# Patient Record
Sex: Female | Born: 1990 | Race: White | Hispanic: No | Marital: Married | Smoking: Never smoker
Health system: Southern US, Community
[De-identification: ages and names within clinical notes are randomized; demographics above are authoritative.]

## PROBLEM LIST (undated history)

## (undated) DIAGNOSIS — Z3184 Encounter for fertility preservation procedure: Secondary | ICD-10-CM

## (undated) DIAGNOSIS — C8198 Hodgkin lymphoma, unspecified, lymph nodes of multiple sites: Secondary | ICD-10-CM

## (undated) HISTORY — DX: Hodgkin lymphoma, unspecified, lymph nodes of multiple sites: C81.98

## (undated) HISTORY — DX: Encounter for fertility preservation procedure: Z31.84

## (undated) HISTORY — PX: OTHER SURGICAL HISTORY: SHX169

---
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
## 2009-05-22 ENCOUNTER — Ambulatory Visit: Payer: Self-pay | Admitting: Pediatrics

## 2010-07-31 HISTORY — PX: LYMPH NODE BIOPSY: SHX201

## 2011-02-27 ENCOUNTER — Ambulatory Visit: Payer: Self-pay | Admitting: Internal Medicine

## 2011-03-03 ENCOUNTER — Ambulatory Visit: Payer: Self-pay | Admitting: Internal Medicine

## 2011-03-03 ENCOUNTER — Other Ambulatory Visit: Payer: Self-pay | Admitting: Internal Medicine

## 2011-05-01 ENCOUNTER — Ambulatory Visit: Payer: Self-pay | Admitting: Internal Medicine

## 2011-05-01 HISTORY — PX: PORTACATH PLACEMENT: SHX2246

## 2011-05-15 ENCOUNTER — Ambulatory Visit: Payer: Self-pay | Admitting: Internal Medicine

## 2011-05-15 ENCOUNTER — Other Ambulatory Visit: Payer: Self-pay | Admitting: Internal Medicine

## 2011-05-16 ENCOUNTER — Ambulatory Visit: Payer: Self-pay | Admitting: Internal Medicine

## 2011-05-17 ENCOUNTER — Ambulatory Visit: Payer: Self-pay | Admitting: Internal Medicine

## 2011-05-19 ENCOUNTER — Ambulatory Visit: Payer: Self-pay | Admitting: Internal Medicine

## 2011-05-24 ENCOUNTER — Ambulatory Visit: Payer: Self-pay | Admitting: General Surgery

## 2011-06-01 ENCOUNTER — Ambulatory Visit: Payer: Self-pay | Admitting: Internal Medicine

## 2011-06-01 DIAGNOSIS — Z3184 Encounter for fertility preservation procedure: Secondary | ICD-10-CM

## 2011-06-01 HISTORY — DX: Encounter for fertility preservation procedure: Z31.84

## 2011-06-01 LAB — PATHOLOGY REPORT

## 2011-06-06 ENCOUNTER — Ambulatory Visit: Payer: Self-pay | Admitting: General Surgery

## 2011-07-01 ENCOUNTER — Ambulatory Visit: Payer: Self-pay | Admitting: Internal Medicine

## 2011-08-01 ENCOUNTER — Ambulatory Visit: Payer: Self-pay | Admitting: Internal Medicine

## 2011-08-04 LAB — CBC CANCER CENTER
Basophil %: 0.5 %
Eosinophil #: 0.2 x10 3/mm (ref 0.0–0.7)
HCT: 35.4 % (ref 35.0–47.0)
Lymphocyte #: 2.9 x10 3/mm (ref 1.0–3.6)
MCV: 87 fL (ref 80–100)
Monocyte #: 0.3 x10 3/mm (ref 0.0–0.7)
Monocyte %: 5.9 %
Neutrophil #: 2.4 x10 3/mm (ref 1.4–6.5)
RBC: 4.09 10*6/uL (ref 3.80–5.20)
WBC: 5.9 x10 3/mm (ref 3.6–11.0)

## 2011-08-11 LAB — COMPREHENSIVE METABOLIC PANEL
Albumin: 3.5 g/dL (ref 3.4–5.0)
Alkaline Phosphatase: 106 U/L (ref 50–136)
Bilirubin,Total: 0.3 mg/dL (ref 0.2–1.0)
Creatinine: 0.89 mg/dL (ref 0.60–1.30)
Glucose: 88 mg/dL (ref 65–99)
Osmolality: 280 (ref 275–301)
Potassium: 4.4 mmol/L (ref 3.5–5.1)
SGOT(AST): 23 U/L (ref 15–37)
Sodium: 141 mmol/L (ref 136–145)
Total Protein: 6.9 g/dL (ref 6.4–8.2)

## 2011-08-11 LAB — CBC CANCER CENTER
Basophil #: 0 x10 3/mm (ref 0.0–0.1)
Basophil %: 0.5 %
Eosinophil #: 0.1 x10 3/mm (ref 0.0–0.7)
Lymphocyte #: 2.8 x10 3/mm (ref 1.0–3.6)
Lymphocyte %: 39.4 %
MCHC: 33.9 g/dL (ref 32.0–36.0)
MCV: 87 fL (ref 80–100)
Monocyte #: 0.5 x10 3/mm (ref 0.0–0.7)
Monocyte %: 6.6 %
Neutrophil #: 3.7 x10 3/mm (ref 1.4–6.5)
Platelet: 289 x10 3/mm (ref 150–440)
RDW: 16.5 % — ABNORMAL HIGH (ref 11.5–14.5)
WBC: 7.2 x10 3/mm (ref 3.6–11.0)

## 2011-08-18 LAB — CBC CANCER CENTER
Basophil #: 0.1 x10 3/mm (ref 0.0–0.1)
Basophil %: 0.9 %
Eosinophil #: 0 x10 3/mm (ref 0.0–0.7)
HCT: 37.8 % (ref 35.0–47.0)
HGB: 12.8 g/dL (ref 12.0–16.0)
Lymphocyte %: 29.8 %
MCHC: 33.7 g/dL (ref 32.0–36.0)
Monocyte #: 0.5 x10 3/mm (ref 0.0–0.7)
Monocyte %: 6 %
Neutrophil #: 4.8 x10 3/mm (ref 1.4–6.5)
Neutrophil %: 62.8 %
Platelet: 397 x10 3/mm (ref 150–440)
RBC: 4.34 10*6/uL (ref 3.80–5.20)
WBC: 7.6 x10 3/mm (ref 3.6–11.0)

## 2011-08-21 LAB — URINALYSIS, COMPLETE
Bilirubin,UR: NEGATIVE
Blood: NEGATIVE
Glucose,UR: NEGATIVE mg/dL (ref 0–75)
Hyaline Cast: 8
Ketone: NEGATIVE
Leukocyte Esterase: NEGATIVE
Nitrite: NEGATIVE
Ph: 6 (ref 4.5–8.0)
Protein: NEGATIVE
RBC,UR: 2 /HPF (ref 0–5)
Specific Gravity: 1.013 (ref 1.003–1.030)
Squamous Epithelial: 5
WBC UR: 7 /HPF (ref 0–5)

## 2011-08-21 LAB — COMPREHENSIVE METABOLIC PANEL
Alkaline Phosphatase: 119 U/L (ref 50–136)
Anion Gap: 8 (ref 7–16)
Bilirubin,Total: 0.3 mg/dL (ref 0.2–1.0)
Calcium, Total: 9.3 mg/dL (ref 8.5–10.1)
Chloride: 103 mmol/L (ref 98–107)
Co2: 29 mmol/L (ref 21–32)
Creatinine: 0.91 mg/dL (ref 0.60–1.30)
EGFR (African American): >60
Glucose: 91 mg/dL (ref 65–99)
Osmolality: 276 (ref 275–301)
SGPT (ALT): 46 U/L
Sodium: 140 mmol/L (ref 136–145)
Total Protein: 7.2 g/dL (ref 6.4–8.2)

## 2011-08-25 LAB — CBC CANCER CENTER
Basophil #: 0 x10 3/mm (ref 0.0–0.1)
Basophil %: 0.4 %
Eosinophil #: 0.1 x10 3/mm (ref 0.0–0.7)
Eosinophil %: 0.5 %
HCT: 33.8 % — ABNORMAL LOW (ref 35.0–47.0)
HGB: 11.5 g/dL — ABNORMAL LOW (ref 12.0–16.0)
Lymphocyte #: 3.1 x10 3/mm (ref 1.0–3.6)
Lymphocyte %: 28.7 %
MCHC: 34 g/dL (ref 32.0–36.0)
MCV: 87 fL (ref 80–100)
Monocyte %: 6.5 %
Neutrophil #: 6.9 x10 3/mm — ABNORMAL HIGH (ref 1.4–6.5)
Neutrophil %: 63.9 %
Platelet: 295 x10 3/mm (ref 150–440)
RDW: 17.3 % — ABNORMAL HIGH (ref 11.5–14.5)

## 2011-08-25 LAB — COMPREHENSIVE METABOLIC PANEL
Albumin: 3.6 g/dL (ref 3.4–5.0)
Alkaline Phosphatase: 93 U/L (ref 50–136)
BUN: 10 mg/dL (ref 7–18)
Bilirubin,Total: 0.3 mg/dL (ref 0.2–1.0)
Chloride: 106 mmol/L (ref 98–107)
Co2: 27 mmol/L (ref 21–32)
Creatinine: 0.73 mg/dL (ref 0.60–1.30)
EGFR (African American): >60
EGFR (Non-African Amer.): >60
Glucose: 95 mg/dL (ref 65–99)
SGOT(AST): 23 U/L (ref 15–37)
SGPT (ALT): 35 U/L
Total Protein: 6.5 g/dL (ref 6.4–8.2)

## 2011-09-01 ENCOUNTER — Ambulatory Visit: Payer: Self-pay | Admitting: Internal Medicine

## 2011-09-01 LAB — CBC CANCER CENTER
Basophil #: 0 x10 3/mm (ref 0.0–0.1)
Basophil %: 0.3 %
Eosinophil #: 0.1 x10 3/mm (ref 0.0–0.7)
HCT: 35.1 % (ref 35.0–47.0)
Lymphocyte %: 22.5 %
MCH: 29.3 pg (ref 26.0–34.0)
MCHC: 33.5 g/dL (ref 32.0–36.0)
Monocyte #: 0.6 x10 3/mm (ref 0.0–0.7)
Monocyte %: 5.6 %
Neutrophil %: 70.5 %
Platelet: 373 x10 3/mm (ref 150–440)
RDW: 17.5 % — ABNORMAL HIGH (ref 11.5–14.5)

## 2011-09-08 LAB — CBC CANCER CENTER
Basophil #: 0 x10 3/mm (ref 0.0–0.1)
Basophil %: 0.2 %
Eosinophil %: 1.4 %
HCT: 32.4 % — ABNORMAL LOW (ref 35.0–47.0)
HGB: 10.8 g/dL — ABNORMAL LOW (ref 12.0–16.0)
MCH: 28.9 pg (ref 26.0–34.0)
MCHC: 33.2 g/dL (ref 32.0–36.0)
MCV: 87 fL (ref 80–100)
Monocyte #: 0.6 x10 3/mm (ref 0.0–0.7)
Monocyte %: 6.5 %
Neutrophil #: 5.2 x10 3/mm (ref 1.4–6.5)
Neutrophil %: 61.5 %
RBC: 3.73 10*6/uL — ABNORMAL LOW (ref 3.80–5.20)
WBC: 8.5 x10 3/mm (ref 3.6–11.0)

## 2011-09-08 LAB — COMPREHENSIVE METABOLIC PANEL
Albumin: 3.6 g/dL (ref 3.4–5.0)
Anion Gap: 10 (ref 7–16)
BUN: 7 mg/dL (ref 7–18)
Bilirubin,Total: 0.3 mg/dL (ref 0.2–1.0)
Creatinine: 0.75 mg/dL (ref 0.60–1.30)
EGFR (African American): >60
EGFR (Non-African Amer.): >60
Glucose: 124 mg/dL — ABNORMAL HIGH (ref 65–99)
Osmolality: 281 (ref 275–301)
Potassium: 3.5 mmol/L (ref 3.5–5.1)
Sodium: 141 mmol/L (ref 136–145)
Total Protein: 6.4 g/dL (ref 6.4–8.2)

## 2011-09-18 LAB — CBC CANCER CENTER
Basophil #: 0 x10 3/mm (ref 0.0–0.1)
Basophil %: 0.2 %
Eosinophil #: 0.1 x10 3/mm (ref 0.0–0.7)
HCT: 34.6 % — ABNORMAL LOW (ref 35.0–47.0)
HGB: 11.5 g/dL — ABNORMAL LOW (ref 12.0–16.0)
Lymphocyte #: 3.2 x10 3/mm (ref 1.0–3.6)
MCH: 29.3 pg (ref 26.0–34.0)
MCHC: 33.2 g/dL (ref 32.0–36.0)
MCV: 88 fL (ref 80–100)
Monocyte #: 1 x10 3/mm — ABNORMAL HIGH (ref 0.0–0.7)
Neutrophil #: 14.2 x10 3/mm — ABNORMAL HIGH (ref 1.4–6.5)
RBC: 3.93 10*6/uL (ref 3.80–5.20)
RDW: 19.3 % — ABNORMAL HIGH (ref 11.5–14.5)
WBC: 18.6 x10 3/mm — ABNORMAL HIGH (ref 3.6–11.0)

## 2011-09-29 ENCOUNTER — Ambulatory Visit: Payer: Self-pay | Admitting: Internal Medicine

## 2011-09-29 LAB — COMPREHENSIVE METABOLIC PANEL
Albumin: 3.4 g/dL (ref 3.4–5.0)
Alkaline Phosphatase: 75 U/L (ref 50–136)
BUN: 10 mg/dL (ref 7–18)
Calcium, Total: 8.5 mg/dL (ref 8.5–10.1)
Co2: 25 mmol/L (ref 21–32)
EGFR (Non-African Amer.): >60
Glucose: 112 mg/dL — ABNORMAL HIGH (ref 65–99)
Potassium: 3.8 mmol/L (ref 3.5–5.1)
SGPT (ALT): 31 U/L
Sodium: 141 mmol/L (ref 136–145)

## 2011-09-29 LAB — CBC CANCER CENTER
Basophil #: 0 x10 3/mm (ref 0.0–0.1)
Basophil %: 0.5 %
Eosinophil %: 1.3 %
HCT: 31.1 % — ABNORMAL LOW (ref 35.0–47.0)
HGB: 10.4 g/dL — ABNORMAL LOW (ref 12.0–16.0)
Lymphocyte #: 1.9 x10 3/mm (ref 1.0–3.6)
Lymphocyte %: 32 %
MCH: 29.8 pg (ref 26.0–34.0)
MCV: 89 fL (ref 80–100)
Monocyte #: 0.6 x10 3/mm (ref 0.0–0.7)
Monocyte %: 9.7 %
Neutrophil #: 3.3 x10 3/mm (ref 1.4–6.5)
Platelet: 366 x10 3/mm (ref 150–440)
RBC: 3.48 10*6/uL — ABNORMAL LOW (ref 3.80–5.20)
WBC: 5.8 x10 3/mm (ref 3.6–11.0)

## 2011-10-04 LAB — CBC CANCER CENTER
Basophil #: 0 x10 3/mm (ref 0.0–0.1)
Basophil %: 0 %
Eosinophil %: 0.3 %
HCT: 30.9 % — ABNORMAL LOW (ref 35.0–47.0)
HGB: 10.5 g/dL — ABNORMAL LOW (ref 12.0–16.0)
Lymphocyte #: 1.3 x10 3/mm (ref 1.0–3.6)
Lymphocyte %: 14.2 %
MCH: 30.4 pg (ref 26.0–34.0)
MCHC: 34.1 g/dL (ref 32.0–36.0)
MCV: 89 fL (ref 80–100)
Monocyte %: 1.7 %
Neutrophil #: 7.7 x10 3/mm — ABNORMAL HIGH (ref 1.4–6.5)
Platelet: 206 x10 3/mm (ref 150–440)
WBC: 9.2 x10 3/mm (ref 3.6–11.0)

## 2011-10-04 LAB — BASIC METABOLIC PANEL
Anion Gap: 9 (ref 7–16)
BUN: 12 mg/dL (ref 7–18)
Creatinine: 0.73 mg/dL (ref 0.60–1.30)
EGFR (African American): >60
EGFR (Non-African Amer.): >60
Glucose: 87 mg/dL (ref 65–99)
Osmolality: 279 (ref 275–301)
Potassium: 4.1 mmol/L (ref 3.5–5.1)
Sodium: 140 mmol/L (ref 136–145)

## 2011-10-13 LAB — COMPREHENSIVE METABOLIC PANEL
Anion Gap: 10 (ref 7–16)
BUN: 11 mg/dL (ref 7–18)
Bilirubin,Total: 0.3 mg/dL (ref 0.2–1.0)
Calcium, Total: 8.8 mg/dL (ref 8.5–10.1)
Chloride: 106 mmol/L (ref 98–107)
Co2: 24 mmol/L (ref 21–32)
Creatinine: 0.79 mg/dL (ref 0.60–1.30)
EGFR (African American): >60
EGFR (Non-African Amer.): >60
Osmolality: 279 (ref 275–301)
Potassium: 4 mmol/L (ref 3.5–5.1)
Sodium: 140 mmol/L (ref 136–145)
Total Protein: 6.9 g/dL (ref 6.4–8.2)

## 2011-10-13 LAB — CBC CANCER CENTER
Basophil #: 0 x10 3/mm (ref 0.0–0.1)
Basophil %: 0.6 %
Eosinophil #: 0.1 x10 3/mm (ref 0.0–0.7)
HCT: 31.1 % — ABNORMAL LOW (ref 35.0–47.0)
HGB: 10.4 g/dL — ABNORMAL LOW (ref 12.0–16.0)
Lymphocyte #: 2.6 x10 3/mm (ref 1.0–3.6)
MCH: 30.1 pg (ref 26.0–34.0)
MCHC: 33.3 g/dL (ref 32.0–36.0)
MCV: 90 fL (ref 80–100)
Monocyte #: 0.6 x10 3/mm (ref 0.0–0.7)
Neutrophil #: 4.1 x10 3/mm (ref 1.4–6.5)
RDW: 18.7 % — ABNORMAL HIGH (ref 11.5–14.5)

## 2011-10-20 LAB — CBC CANCER CENTER
Basophil #: 0 x10 3/mm (ref 0.0–0.1)
Eosinophil #: 0.1 x10 3/mm (ref 0.0–0.7)
Eosinophil %: 1.6 %
Lymphocyte #: 2 x10 3/mm (ref 1.0–3.6)
Lymphocyte %: 24.1 %
MCH: 30 pg (ref 26.0–34.0)
MCHC: 33.4 g/dL (ref 32.0–36.0)
Monocyte #: 0.5 x10 3/mm (ref 0.0–0.7)
Monocyte %: 6.5 %
Neutrophil %: 67.2 %
Platelet: 351 x10 3/mm (ref 150–440)
RDW: 19.2 % — ABNORMAL HIGH (ref 11.5–14.5)
WBC: 8.3 x10 3/mm (ref 3.6–11.0)

## 2011-10-26 LAB — COMPREHENSIVE METABOLIC PANEL
Alkaline Phosphatase: 107 U/L (ref 50–136)
Anion Gap: 9 (ref 7–16)
Bilirubin,Total: 0.4 mg/dL (ref 0.2–1.0)
Calcium, Total: 8.5 mg/dL (ref 8.5–10.1)
Chloride: 105 mmol/L (ref 98–107)
Co2: 25 mmol/L (ref 21–32)
Creatinine: 0.9 mg/dL (ref 0.60–1.30)
EGFR (Non-African Amer.): >60
Glucose: 87 mg/dL (ref 65–99)
Osmolality: 277 (ref 275–301)
SGOT(AST): 18 U/L (ref 15–37)
SGPT (ALT): 23 U/L
Sodium: 139 mmol/L (ref 136–145)
Total Protein: 6.7 g/dL (ref 6.4–8.2)

## 2011-10-26 LAB — CBC CANCER CENTER
Basophil #: 0 x10 3/mm (ref 0.0–0.1)
Eosinophil #: 0.1 x10 3/mm (ref 0.0–0.7)
Eosinophil %: 1.3 %
Lymphocyte #: 2.5 x10 3/mm (ref 1.0–3.6)
Lymphocyte %: 24.9 %
MCH: 30.5 pg (ref 26.0–34.0)
MCHC: 33.5 g/dL (ref 32.0–36.0)
MCV: 91 fL (ref 80–100)
Monocyte #: 0.8 x10 3/mm — ABNORMAL HIGH (ref 0.0–0.7)
Neutrophil %: 65.4 %
RBC: 3.5 10*6/uL — ABNORMAL LOW (ref 3.80–5.20)
WBC: 10 x10 3/mm (ref 3.6–11.0)

## 2011-10-30 ENCOUNTER — Ambulatory Visit: Payer: Self-pay | Admitting: Internal Medicine

## 2011-11-03 ENCOUNTER — Ambulatory Visit: Payer: Self-pay | Admitting: Oncology

## 2011-11-03 LAB — CBC CANCER CENTER
Basophil #: 0 x10 3/mm (ref 0.0–0.1)
Basophil %: 0.3 %
Eosinophil #: 0.2 x10 3/mm (ref 0.0–0.7)
Eosinophil %: 1.1 %
HCT: 31 % — ABNORMAL LOW (ref 35.0–47.0)
HGB: 10.5 g/dL — ABNORMAL LOW (ref 12.0–16.0)
Lymphocyte #: 2.6 x10 3/mm (ref 1.0–3.6)
Lymphocyte %: 18.5 %
MCH: 30.5 pg (ref 26.0–34.0)
MCHC: 33.8 g/dL (ref 32.0–36.0)
MCV: 90 fL (ref 80–100)
Monocyte #: 0.8 x10 3/mm — ABNORMAL HIGH (ref 0.0–0.7)
Monocyte %: 5.9 %
Neutrophil #: 10.6 x10 3/mm — ABNORMAL HIGH (ref 1.4–6.5)
Neutrophil %: 74.2 %
Platelet: 389 x10 3/mm (ref 150–440)
RBC: 3.44 10*6/uL — ABNORMAL LOW (ref 3.80–5.20)
RDW: 19.2 % — ABNORMAL HIGH (ref 11.5–14.5)
WBC: 14.3 x10 3/mm — ABNORMAL HIGH (ref 3.6–11.0)

## 2011-11-10 LAB — BASIC METABOLIC PANEL
Anion Gap: 8 (ref 7–16)
Chloride: 106 mmol/L (ref 98–107)
Co2: 25 mmol/L (ref 21–32)
Glucose: 89 mg/dL (ref 65–99)
Osmolality: 277 (ref 275–301)
Sodium: 139 mmol/L (ref 136–145)

## 2011-11-10 LAB — CBC CANCER CENTER
Basophil #: 0.1 x10 3/mm (ref 0.0–0.1)
Basophil %: 0.6 %
Eosinophil #: 0.1 x10 3/mm (ref 0.0–0.7)
Eosinophil %: 1.4 %
HGB: 10.3 g/dL — ABNORMAL LOW (ref 12.0–16.0)
Lymphocyte %: 26.3 %
MCH: 29.8 pg (ref 26.0–34.0)
MCV: 93 fL (ref 80–100)
Neutrophil #: 5 x10 3/mm (ref 1.4–6.5)
Neutrophil %: 63.4 %
RDW: 19.1 % — ABNORMAL HIGH (ref 11.5–14.5)

## 2011-11-24 LAB — COMPREHENSIVE METABOLIC PANEL
Alkaline Phosphatase: 88 U/L (ref 50–136)
Anion Gap: 10 (ref 7–16)
Bilirubin,Total: 0.4 mg/dL (ref 0.2–1.0)
Creatinine: 0.58 mg/dL — ABNORMAL LOW (ref 0.60–1.30)
Potassium: 4 mmol/L (ref 3.5–5.1)
SGOT(AST): 11 U/L — ABNORMAL LOW (ref 15–37)
SGPT (ALT): 21 U/L
Sodium: 142 mmol/L (ref 136–145)

## 2011-11-24 LAB — CBC CANCER CENTER
Basophil #: 0 x10 3/mm (ref 0.0–0.1)
Basophil %: 0.9 %
Eosinophil %: 2.1 %
HGB: 9.8 g/dL — ABNORMAL LOW (ref 12.0–16.0)
Lymphocyte %: 33.7 %
MCH: 29.5 pg (ref 26.0–34.0)
MCHC: 31.6 g/dL — ABNORMAL LOW (ref 32.0–36.0)
Monocyte #: 0.6 x10 3/mm (ref 0.2–0.9)
Neutrophil %: 53.5 %
Platelet: 327 x10 3/mm (ref 150–440)
RBC: 3.34 10*6/uL — ABNORMAL LOW (ref 3.80–5.20)
RDW: 19 % — ABNORMAL HIGH (ref 11.5–14.5)
WBC: 5.6 x10 3/mm (ref 3.6–11.0)

## 2011-11-29 ENCOUNTER — Ambulatory Visit: Payer: Self-pay | Admitting: Internal Medicine

## 2011-12-11 LAB — COMPREHENSIVE METABOLIC PANEL
Alkaline Phosphatase: 92 U/L (ref 50–136)
BUN: 10 mg/dL (ref 7–18)
Bilirubin,Total: 0.5 mg/dL (ref 0.2–1.0)
Chloride: 105 mmol/L (ref 98–107)
Co2: 24 mmol/L (ref 21–32)
Creatinine: 0.57 mg/dL — ABNORMAL LOW (ref 0.60–1.30)
EGFR (African American): >60
Potassium: 3.8 mmol/L (ref 3.5–5.1)
SGOT(AST): 23 U/L (ref 15–37)
SGPT (ALT): 26 U/L
Total Protein: 6.2 g/dL — ABNORMAL LOW (ref 6.4–8.2)

## 2011-12-11 LAB — CBC CANCER CENTER
Basophil %: 0.4 %
Eosinophil #: 0.1 x10 3/mm (ref 0.0–0.7)
HGB: 10.3 g/dL — ABNORMAL LOW (ref 12.0–16.0)
Lymphocyte %: 26.8 %
MCH: 30.1 pg (ref 26.0–34.0)
MCV: 93 fL (ref 80–100)
Monocyte #: 0.6 x10 3/mm (ref 0.2–0.9)
Monocyte %: 9 %
RBC: 3.43 10*6/uL — ABNORMAL LOW (ref 3.80–5.20)

## 2011-12-26 LAB — COMPREHENSIVE METABOLIC PANEL
Alkaline Phosphatase: 99 U/L (ref 50–136)
BUN: 11 mg/dL (ref 7–18)
Bilirubin,Total: 0.4 mg/dL (ref 0.2–1.0)
Calcium, Total: 8.5 mg/dL (ref 8.5–10.1)
Chloride: 107 mmol/L (ref 98–107)
Creatinine: 0.75 mg/dL (ref 0.60–1.30)
EGFR (African American): >60
EGFR (Non-African Amer.): >60
Potassium: 4.1 mmol/L (ref 3.5–5.1)
SGOT(AST): 19 U/L (ref 15–37)
SGPT (ALT): 23 U/L
Sodium: 142 mmol/L (ref 136–145)
Total Protein: 6.5 g/dL (ref 6.4–8.2)

## 2011-12-26 LAB — CBC CANCER CENTER
Basophil #: 0 x10 3/mm (ref 0.0–0.1)
Basophil %: 0.6 %
Eosinophil %: 3.5 %
HGB: 10.6 g/dL — ABNORMAL LOW (ref 12.0–16.0)
Lymphocyte #: 2.1 x10 3/mm (ref 1.0–3.6)
MCH: 30.1 pg (ref 26.0–34.0)
MCV: 93 fL (ref 80–100)
Neutrophil #: 5.1 x10 3/mm (ref 1.4–6.5)
Neutrophil %: 63.6 %
WBC: 8 x10 3/mm (ref 3.6–11.0)

## 2011-12-30 ENCOUNTER — Ambulatory Visit: Payer: Self-pay | Admitting: Internal Medicine

## 2012-02-06 ENCOUNTER — Ambulatory Visit: Payer: Self-pay | Admitting: Oncology

## 2012-02-06 LAB — CBC CANCER CENTER
Basophil #: 0 x10 3/mm (ref 0.0–0.1)
Lymphocyte #: 2.7 x10 3/mm (ref 1.0–3.6)
MCV: 91 fL (ref 80–100)
Monocyte %: 8.1 %
Platelet: 313 x10 3/mm (ref 150–440)
RDW: 17.8 % — ABNORMAL HIGH (ref 11.5–14.5)
WBC: 5.5 x10 3/mm (ref 3.6–11.0)

## 2012-02-06 LAB — COMPREHENSIVE METABOLIC PANEL
Albumin: 3.6 g/dL (ref 3.4–5.0)
Anion Gap: 6 — ABNORMAL LOW (ref 7–16)
BUN: 14 mg/dL (ref 7–18)
Bilirubin,Total: 0.6 mg/dL (ref 0.2–1.0)
Chloride: 105 mmol/L (ref 98–107)
Co2: 28 mmol/L (ref 21–32)
Creatinine: 0.87 mg/dL (ref 0.60–1.30)
Osmolality: 277 (ref 275–301)
Potassium: 3.8 mmol/L (ref 3.5–5.1)
Sodium: 139 mmol/L (ref 136–145)
Total Protein: 6.9 g/dL (ref 6.4–8.2)

## 2012-02-06 LAB — SEDIMENTATION RATE: Erythrocyte Sed Rate: 13 mm/hr (ref 0–20)

## 2012-02-29 ENCOUNTER — Ambulatory Visit: Payer: Self-pay | Admitting: Oncology

## 2012-03-19 LAB — COMPREHENSIVE METABOLIC PANEL
Albumin: 3.6 g/dL (ref 3.4–5.0)
Anion Gap: 9 (ref 7–16)
Calcium, Total: 8.8 mg/dL (ref 8.5–10.1)
Chloride: 104 mmol/L (ref 98–107)
Co2: 25 mmol/L (ref 21–32)
EGFR (African American): >60
EGFR (Non-African Amer.): >60
Osmolality: 276 (ref 275–301)
Potassium: 3.8 mmol/L (ref 3.5–5.1)
SGOT(AST): 19 U/L (ref 15–37)
Sodium: 138 mmol/L (ref 136–145)

## 2012-03-19 LAB — CBC CANCER CENTER
Basophil #: 0 x10 3/mm (ref 0.0–0.1)
Eosinophil %: 1.5 %
HGB: 10.9 g/dL — ABNORMAL LOW (ref 12.0–16.0)
Lymphocyte %: 30.9 %
MCV: 89 fL (ref 80–100)
Monocyte %: 5.3 %
Neutrophil %: 61.8 %
Platelet: 298 x10 3/mm (ref 150–440)
RBC: 3.78 10*6/uL — ABNORMAL LOW (ref 3.80–5.20)
WBC: 6.3 x10 3/mm (ref 3.6–11.0)

## 2012-03-31 ENCOUNTER — Ambulatory Visit: Payer: Self-pay | Admitting: Oncology

## 2012-05-14 ENCOUNTER — Ambulatory Visit: Payer: Self-pay | Admitting: Oncology

## 2012-05-31 ENCOUNTER — Ambulatory Visit: Payer: Self-pay | Admitting: Oncology

## 2012-06-17 LAB — CBC CANCER CENTER
Basophil #: 0 x10 3/mm (ref 0.0–0.1)
HGB: 12.2 g/dL (ref 12.0–16.0)
Lymphocyte #: 2.1 x10 3/mm (ref 1.0–3.6)
MCH: 29.5 pg (ref 26.0–34.0)
MCV: 91 fL (ref 80–100)
Monocyte #: 0.3 x10 3/mm (ref 0.2–0.9)
Monocyte %: 6.6 %
Neutrophil #: 1.4 x10 3/mm (ref 1.4–6.5)
Neutrophil %: 35.5 %
Platelet: 278 x10 3/mm (ref 150–440)
RDW: 16 % — ABNORMAL HIGH (ref 11.5–14.5)

## 2012-06-17 LAB — COMPREHENSIVE METABOLIC PANEL
Alkaline Phosphatase: 74 U/L (ref 50–136)
BUN: 12 mg/dL (ref 7–18)
Chloride: 105 mmol/L (ref 98–107)
Co2: 24 mmol/L (ref 21–32)
EGFR (African American): >60
EGFR (Non-African Amer.): >60
SGOT(AST): 17 U/L (ref 15–37)
SGPT (ALT): 19 U/L (ref 12–78)
Total Protein: 6.9 g/dL (ref 6.4–8.2)

## 2012-06-17 LAB — LACTATE DEHYDROGENASE: LDH: 143 U/L (ref 81–246)

## 2012-06-21 ENCOUNTER — Ambulatory Visit: Payer: Self-pay | Admitting: Oncology

## 2012-06-30 ENCOUNTER — Ambulatory Visit: Payer: Self-pay | Admitting: Oncology

## 2012-06-30 ENCOUNTER — Ambulatory Visit: Payer: Self-pay | Admitting: Internal Medicine

## 2012-10-09 ENCOUNTER — Ambulatory Visit: Payer: Self-pay | Admitting: Oncology

## 2012-10-10 LAB — COMPREHENSIVE METABOLIC PANEL
Albumin: 3.7 g/dL (ref 3.4–5.0)
Alkaline Phosphatase: 68 U/L (ref 50–136)
Anion Gap: 8 (ref 7–16)
BUN: 15 mg/dL (ref 7–18)
Bilirubin,Total: 1.1 mg/dL — ABNORMAL HIGH (ref 0.2–1.0)
Calcium, Total: 8.5 mg/dL (ref 8.5–10.1)
Chloride: 104 mmol/L (ref 98–107)
Co2: 27 mmol/L (ref 21–32)
Creatinine: 1.11 mg/dL (ref 0.60–1.30)
EGFR (African American): >60
EGFR (Non-African Amer.): >60
Glucose: 96 mg/dL (ref 65–99)
Osmolality: 278 (ref 275–301)
Potassium: 4 mmol/L (ref 3.5–5.1)
SGOT(AST): 14 U/L — ABNORMAL LOW (ref 15–37)
SGPT (ALT): 17 U/L (ref 12–78)
Sodium: 139 mmol/L (ref 136–145)
Total Protein: 7 g/dL (ref 6.4–8.2)

## 2012-10-10 LAB — CBC CANCER CENTER
Basophil #: 0 x10 3/mm (ref 0.0–0.1)
Basophil %: 0.8 %
Eosinophil #: 0.1 x10 3/mm (ref 0.0–0.7)
Eosinophil %: 1.1 %
HCT: 37.1 % (ref 35.0–47.0)
HGB: 12.8 g/dL (ref 12.0–16.0)
Lymphocyte #: 2.5 x10 3/mm (ref 1.0–3.6)
Lymphocyte %: 46.1 %
MCH: 32.1 pg (ref 26.0–34.0)
MCHC: 34.6 g/dL (ref 32.0–36.0)
MCV: 93 fL (ref 80–100)
Monocyte #: 0.3 x10 3/mm (ref 0.2–0.9)
Monocyte %: 5.9 %
Neutrophil #: 2.5 x10 3/mm (ref 1.4–6.5)
Neutrophil %: 46.1 %
Platelet: 281 x10 3/mm (ref 150–440)
RBC: 4 10*6/uL (ref 3.80–5.20)
RDW: 13.4 % (ref 11.5–14.5)
WBC: 5.5 x10 3/mm (ref 3.6–11.0)

## 2012-10-10 LAB — LACTATE DEHYDROGENASE: LDH: 138 U/L (ref 81–246)

## 2012-10-29 ENCOUNTER — Ambulatory Visit: Payer: Self-pay | Admitting: Oncology

## 2012-11-27 ENCOUNTER — Encounter: Payer: Self-pay | Admitting: *Deleted

## 2012-12-11 ENCOUNTER — Telehealth: Payer: Self-pay | Admitting: *Deleted

## 2012-12-11 NOTE — Telephone Encounter (Signed)
Mom called to say they are nervous about the port a cath being removed December 30 2012.  Questions answered and procedure reviewed.  Aware she may come and sign consent prior to procedure and then can take her Xanax day of procedure per Dr Lemar Livings.

## 2012-12-16 ENCOUNTER — Ambulatory Visit: Payer: Self-pay | Admitting: General Surgery

## 2012-12-29 ENCOUNTER — Ambulatory Visit: Payer: Self-pay | Admitting: Oncology

## 2012-12-30 ENCOUNTER — Ambulatory Visit: Payer: Self-pay | Admitting: General Surgery

## 2013-01-06 ENCOUNTER — Ambulatory Visit: Payer: Self-pay | Admitting: Oncology

## 2013-01-06 LAB — CBC CANCER CENTER
Basophil #: 0.1 x10 3/mm (ref 0.0–0.1)
Basophil %: 0.9 %
Eosinophil %: 1.7 %
HCT: 38.8 % (ref 35.0–47.0)
HGB: 13.2 g/dL (ref 12.0–16.0)
Lymphocyte #: 2.7 x10 3/mm (ref 1.0–3.6)
Lymphocyte %: 46 %
MCHC: 34 g/dL (ref 32.0–36.0)
Monocyte #: 0.4 x10 3/mm (ref 0.2–0.9)
Neutrophil %: 44.6 %
Platelet: 297 x10 3/mm (ref 150–440)
RBC: 4.1 10*6/uL (ref 3.80–5.20)
WBC: 5.9 x10 3/mm (ref 3.6–11.0)

## 2013-01-06 LAB — LACTATE DEHYDROGENASE: LDH: 148 U/L (ref 81–246)

## 2013-01-06 LAB — COMPREHENSIVE METABOLIC PANEL
Alkaline Phosphatase: 78 U/L (ref 50–136)
Bilirubin,Total: 1 mg/dL (ref 0.2–1.0)
Calcium, Total: 8.9 mg/dL (ref 8.5–10.1)
Chloride: 103 mmol/L (ref 98–107)
Co2: 30 mmol/L (ref 21–32)
EGFR (African American): >60
EGFR (Non-African Amer.): >60
Potassium: 3.8 mmol/L (ref 3.5–5.1)
Sodium: 138 mmol/L (ref 136–145)

## 2013-01-23 ENCOUNTER — Ambulatory Visit: Payer: Self-pay | Admitting: General Surgery

## 2013-01-28 ENCOUNTER — Ambulatory Visit: Payer: Self-pay | Admitting: Oncology

## 2013-01-30 ENCOUNTER — Encounter: Payer: Self-pay | Admitting: *Deleted

## 2013-02-06 ENCOUNTER — Encounter: Payer: Self-pay | Admitting: General Surgery

## 2013-02-06 ENCOUNTER — Ambulatory Visit (INDEPENDENT_AMBULATORY_CARE_PROVIDER_SITE_OTHER): Payer: BC Managed Care – PPO | Admitting: General Surgery

## 2013-02-06 VITALS — BP 122/74 | HR 74 | Resp 12 | Ht 66.0 in | Wt 160.0 lb

## 2013-02-06 DIAGNOSIS — C8589 Other specified types of non-Hodgkin lymphoma, extranodal and solid organ sites: Secondary | ICD-10-CM

## 2013-02-06 DIAGNOSIS — C8590 Non-Hodgkin lymphoma, unspecified, unspecified site: Secondary | ICD-10-CM

## 2013-02-06 NOTE — Patient Instructions (Addendum)
Patient to return in one week. 

## 2013-02-06 NOTE — Progress Notes (Signed)
Patient ID: Alisha Vincent, female   DOB: 1990-11-27, 22 y.o.   MRN: [id number]  Chief Complaint  Patient presents with  . Procedure    port a cath removal    HPI Alisha Vincent is a 22 y.o. female here today for an port removal. A request for port removal has been obtained for the patient's treating oncologist, Gerarda Fraction, M.D. HPI  Past Medical History  Diagnosis Date  . Hodgkin's disease, unspecified type, of lymph nodes of multiple sites   . Encounter for fertility preservation procedure Nov 2012    Past Surgical History  Procedure Laterality Date  . Lymph node biopsy  2012  . Wisdom teeth removal   AGE 20  . Portacath placement  Oct 2012    No family history on file.  Social History History  Substance Use Topics  . Smoking status: Never Smoker   . Smokeless tobacco: Never Used  . Alcohol Use: Yes    No Known Allergies  No current outpatient prescriptions on file.   No current facility-administered medications for this visit.    Review of Systems Review of Systems  Constitutional: Negative.   Respiratory: Negative.   Cardiovascular: Negative.     Blood pressure 122/74, pulse 74, resp. rate 12, height 5\' 6"  (1.676 m), weight 160 lb (72.576 kg), last menstrual period 02/05/2013.  Physical Exam Physical Exam The power port site on the left chest wall shows no evidence of infection or induration.  Data Reviewed No data to review.  Assessment    Hodgkin's disease, no need for continued central venous access.     Plan    The procedure was reviewed with the patient and her mother, the lateral range of the procedure room during the excision. The site was prepped with ChloraPrep and 10 cc of 0.5% Xylocaine with 0.25% Marcaine with 1-200,000 units of epinephrine and was well tolerated. This was supplemented with 5 cc 1% plain Xylocaine during the procedure. ChloraPrep was again applied to the skin and the previous incision excised, as the scar had widened to  about 8 mm in width. The excised skin was discarded. The port was mobilized to the transfixion sutures removed. The catheter was removed intact without incident. The wound was closed in layers with a running 3-0 Vicryl to the adipose layer and a running 4-0 Vicryl subcutaneous to sutured to the skin. Benzoin and Steri-Strips followed by Telfa Tegaderm dressing was applied. The procedure was well tolerated. Post procedure instructions were provided. The patient will return in one week for nursing check.       Alisha Vincent 02/07/2013, 6:54 PM

## 2013-02-07 ENCOUNTER — Encounter: Payer: Self-pay | Admitting: General Surgery

## 2013-02-07 DIAGNOSIS — C8190 Hodgkin lymphoma, unspecified, unspecified site: Secondary | ICD-10-CM | POA: Insufficient documentation

## 2013-02-12 ENCOUNTER — Encounter: Payer: Self-pay | Admitting: General Surgery

## 2013-02-13 ENCOUNTER — Ambulatory Visit (INDEPENDENT_AMBULATORY_CARE_PROVIDER_SITE_OTHER): Payer: BC Managed Care – PPO | Admitting: *Deleted

## 2013-02-13 DIAGNOSIS — C8589 Other specified types of non-Hodgkin lymphoma, extranodal and solid organ sites: Secondary | ICD-10-CM

## 2013-02-13 DIAGNOSIS — C8590 Non-Hodgkin lymphoma, unspecified, unspecified site: Secondary | ICD-10-CM

## 2013-02-13 NOTE — Progress Notes (Signed)
Patient came in today for an  post port removal checked.  The wound is clean, with no signs of infection noted. Steristrip in place and aware it may come off in one week.  Minimal bruising noted.  The patient is aware that a heating pad may be used for comfort as needed. Follow up as scheduled.

## 2013-04-18 ENCOUNTER — Ambulatory Visit: Payer: Self-pay | Admitting: Oncology

## 2013-04-18 LAB — CBC CANCER CENTER
Basophil %: 0.7 %
HCT: 41.8 % (ref 35.0–47.0)
HGB: 14.2 g/dL (ref 12.0–16.0)
MCHC: 34 g/dL (ref 32.0–36.0)
MCV: 95 fL (ref 80–100)
Monocyte #: 0.4 x10 3/mm (ref 0.2–0.9)
Monocyte %: 5.1 %
Neutrophil #: 3.7 x10 3/mm (ref 1.4–6.5)
Neutrophil %: 49.7 %
Platelet: 301 x10 3/mm (ref 150–440)
RDW: 13 % (ref 11.5–14.5)

## 2013-04-18 LAB — COMPREHENSIVE METABOLIC PANEL
Albumin: 4 g/dL (ref 3.4–5.0)
Bilirubin,Total: 1.2 mg/dL — ABNORMAL HIGH (ref 0.2–1.0)
Calcium, Total: 9.5 mg/dL (ref 8.5–10.1)
Co2: 30 mmol/L (ref 21–32)
EGFR (African American): >60
Osmolality: 279 (ref 275–301)
Potassium: 3.9 mmol/L (ref 3.5–5.1)
SGOT(AST): 14 U/L — ABNORMAL LOW (ref 15–37)
SGPT (ALT): 16 U/L (ref 12–78)
Sodium: 141 mmol/L (ref 136–145)
Total Protein: 7.2 g/dL (ref 6.4–8.2)

## 2013-04-18 LAB — LACTATE DEHYDROGENASE: LDH: 126 U/L (ref 81–246)

## 2013-04-18 LAB — MAGNESIUM: Magnesium: 1.9 mg/dL

## 2013-04-30 ENCOUNTER — Ambulatory Visit: Payer: Self-pay | Admitting: Oncology

## 2013-07-16 ENCOUNTER — Ambulatory Visit: Payer: Self-pay | Admitting: Oncology

## 2013-07-16 LAB — CBC CANCER CENTER
Basophil #: 0 x10 3/mm (ref 0.0–0.1)
Eosinophil #: 0.1 x10 3/mm (ref 0.0–0.7)
HGB: 13.4 g/dL (ref 12.0–16.0)
Lymphocyte #: 2.5 x10 3/mm (ref 1.0–3.6)
Lymphocyte %: 36.4 %
MCH: 31.5 pg (ref 26.0–34.0)
MCHC: 33.3 g/dL (ref 32.0–36.0)
MCV: 95 fL (ref 80–100)
Monocyte %: 5.8 %
Neutrophil #: 3.8 x10 3/mm (ref 1.4–6.5)
Neutrophil %: 56.5 %
RBC: 4.26 10*6/uL (ref 3.80–5.20)
RDW: 12.4 % (ref 11.5–14.5)
WBC: 6.7 x10 3/mm (ref 3.6–11.0)

## 2013-07-16 LAB — COMPREHENSIVE METABOLIC PANEL
Albumin: 3.7 g/dL (ref 3.4–5.0)
Alkaline Phosphatase: 62 U/L
Anion Gap: 9 (ref 7–16)
Calcium, Total: 8.9 mg/dL (ref 8.5–10.1)
Creatinine: 0.83 mg/dL (ref 0.60–1.30)
EGFR (African American): >60
EGFR (Non-African Amer.): >60
Osmolality: 277 (ref 275–301)
Potassium: 3.7 mmol/L (ref 3.5–5.1)
SGPT (ALT): 19 U/L (ref 12–78)
Sodium: 140 mmol/L (ref 136–145)
Total Protein: 7 g/dL (ref 6.4–8.2)

## 2013-07-31 ENCOUNTER — Ambulatory Visit: Payer: Self-pay | Admitting: Oncology

## 2013-10-15 ENCOUNTER — Ambulatory Visit: Payer: Self-pay | Admitting: Internal Medicine

## 2013-10-16 ENCOUNTER — Ambulatory Visit: Payer: Self-pay | Admitting: Oncology

## 2013-10-17 LAB — CBC CANCER CENTER
Basophil #: 0.1 x10 3/mm (ref 0.0–0.1)
Basophil %: 0.5 %
EOS ABS: 0.1 x10 3/mm (ref 0.0–0.7)
Eosinophil %: 1.2 %
HCT: 38.6 % (ref 35.0–47.0)
HGB: 12.7 g/dL (ref 12.0–16.0)
Lymphocyte #: 2.7 x10 3/mm (ref 1.0–3.6)
Lymphocyte %: 25.4 %
MCH: 31.4 pg (ref 26.0–34.0)
MCHC: 33 g/dL (ref 32.0–36.0)
MCV: 95 fL (ref 80–100)
MONO ABS: 0.6 x10 3/mm (ref 0.2–0.9)
MONOS PCT: 5.4 %
NEUTROS ABS: 7 x10 3/mm — AB (ref 1.4–6.5)
Neutrophil %: 67.5 %
Platelet: 326 x10 3/mm (ref 150–440)
RBC: 4.07 10*6/uL (ref 3.80–5.20)
RDW: 12.7 % (ref 11.5–14.5)
WBC: 10.4 x10 3/mm (ref 3.6–11.0)

## 2013-10-17 LAB — COMPREHENSIVE METABOLIC PANEL
ALBUMIN: 3.4 g/dL (ref 3.4–5.0)
ALT: 11 U/L — AB (ref 12–78)
Alkaline Phosphatase: 84 U/L
Anion Gap: 9 (ref 7–16)
BILIRUBIN TOTAL: 0.5 mg/dL (ref 0.2–1.0)
BUN: 13 mg/dL (ref 7–18)
CALCIUM: 9 mg/dL (ref 8.5–10.1)
Chloride: 105 mmol/L (ref 98–107)
Co2: 28 mmol/L (ref 21–32)
Creatinine: 0.83 mg/dL (ref 0.60–1.30)
EGFR (African American): >60
Glucose: 88 mg/dL (ref 65–99)
OSMOLALITY: 283 (ref 275–301)
Potassium: 3.8 mmol/L (ref 3.5–5.1)
SGOT(AST): 12 U/L — ABNORMAL LOW (ref 15–37)
Sodium: 142 mmol/L (ref 136–145)
Total Protein: 7.1 g/dL (ref 6.4–8.2)

## 2013-10-17 LAB — LACTATE DEHYDROGENASE: LDH: 114 U/L (ref 81–246)

## 2013-10-29 ENCOUNTER — Ambulatory Visit: Payer: Self-pay | Admitting: Oncology

## 2014-01-16 ENCOUNTER — Ambulatory Visit: Payer: Self-pay | Admitting: Oncology

## 2014-01-23 ENCOUNTER — Ambulatory Visit: Payer: Self-pay | Admitting: Oncology

## 2014-01-23 LAB — CBC CANCER CENTER
Basophil #: 0 x10 3/mm (ref 0.0–0.1)
Basophil %: 0.5 %
EOS ABS: 0.1 x10 3/mm (ref 0.0–0.7)
Eosinophil %: 1 %
HCT: 37.1 % (ref 35.0–47.0)
HGB: 12.1 g/dL (ref 12.0–16.0)
Lymphocyte #: 2.7 x10 3/mm (ref 1.0–3.6)
Lymphocyte %: 34.7 %
MCH: 30 pg (ref 26.0–34.0)
MCHC: 32.8 g/dL (ref 32.0–36.0)
MCV: 92 fL (ref 80–100)
Monocyte #: 0.4 x10 3/mm (ref 0.2–0.9)
Monocyte %: 5.7 %
Neutrophil #: 4.6 x10 3/mm (ref 1.4–6.5)
Neutrophil %: 58.1 %
PLATELETS: 386 x10 3/mm (ref 150–440)
RBC: 4.05 10*6/uL (ref 3.80–5.20)
RDW: 13.5 % (ref 11.5–14.5)
WBC: 7.9 x10 3/mm (ref 3.6–11.0)

## 2014-01-23 LAB — COMPREHENSIVE METABOLIC PANEL
ALK PHOS: 91 U/L
ALT: 21 U/L (ref 12–78)
Albumin: 3.3 g/dL — ABNORMAL LOW (ref 3.4–5.0)
Anion Gap: 8 (ref 7–16)
BILIRUBIN TOTAL: 0.6 mg/dL (ref 0.2–1.0)
BUN: 7 mg/dL (ref 7–18)
Calcium, Total: 9 mg/dL (ref 8.5–10.1)
Chloride: 103 mmol/L (ref 98–107)
Co2: 29 mmol/L (ref 21–32)
Creatinine: 0.86 mg/dL (ref 0.60–1.30)
Glucose: 100 mg/dL — ABNORMAL HIGH (ref 65–99)
Osmolality: 277 (ref 275–301)
POTASSIUM: 3.6 mmol/L (ref 3.5–5.1)
SGOT(AST): 19 U/L (ref 15–37)
Sodium: 140 mmol/L (ref 136–145)
Total Protein: 7.5 g/dL (ref 6.4–8.2)

## 2014-01-23 LAB — LACTATE DEHYDROGENASE: LDH: 133 U/L (ref 81–246)

## 2014-01-28 ENCOUNTER — Ambulatory Visit: Payer: Self-pay | Admitting: Oncology

## 2014-02-23 ENCOUNTER — Ambulatory Visit: Payer: Self-pay | Admitting: Oncology

## 2014-02-28 ENCOUNTER — Ambulatory Visit: Payer: Self-pay | Admitting: Oncology

## 2014-03-02 LAB — COMPREHENSIVE METABOLIC PANEL
ALK PHOS: 100 U/L
Albumin: 3.7 g/dL (ref 3.4–5.0)
Anion Gap: 10 (ref 7–16)
BUN: 10 mg/dL (ref 7–18)
Bilirubin,Total: 0.9 mg/dL (ref 0.2–1.0)
CALCIUM: 9.6 mg/dL (ref 8.5–10.1)
CHLORIDE: 103 mmol/L (ref 98–107)
CO2: 26 mmol/L (ref 21–32)
Creatinine: 0.89 mg/dL (ref 0.60–1.30)
Glucose: 91 mg/dL (ref 65–99)
Osmolality: 276 (ref 275–301)
Potassium: 4.3 mmol/L (ref 3.5–5.1)
SGOT(AST): 12 U/L — ABNORMAL LOW (ref 15–37)
SGPT (ALT): 14 U/L
Sodium: 139 mmol/L (ref 136–145)
TOTAL PROTEIN: 8.2 g/dL (ref 6.4–8.2)

## 2014-03-02 LAB — CBC CANCER CENTER
BASOS ABS: 0 x10 3/mm (ref 0.0–0.1)
Basophil %: 0.8 %
EOS PCT: 0.6 %
Eosinophil #: 0 x10 3/mm (ref 0.0–0.7)
HCT: 39.3 % (ref 35.0–47.0)
HGB: 13.1 g/dL (ref 12.0–16.0)
LYMPHS ABS: 2.2 x10 3/mm (ref 1.0–3.6)
Lymphocyte %: 36.4 %
MCH: 30 pg (ref 26.0–34.0)
MCHC: 33.2 g/dL (ref 32.0–36.0)
MCV: 90 fL (ref 80–100)
MONO ABS: 0.3 x10 3/mm (ref 0.2–0.9)
Monocyte %: 5 %
Neutrophil #: 3.5 x10 3/mm (ref 1.4–6.5)
Neutrophil %: 57.2 %
PLATELETS: 412 x10 3/mm (ref 150–440)
RBC: 4.36 10*6/uL (ref 3.80–5.20)
RDW: 13.8 % (ref 11.5–14.5)
WBC: 6.1 x10 3/mm (ref 3.6–11.0)

## 2014-03-02 LAB — LACTATE DEHYDROGENASE: LDH: 126 U/L (ref 81–246)

## 2014-03-31 ENCOUNTER — Ambulatory Visit: Payer: Self-pay | Admitting: Oncology

## 2014-04-30 ENCOUNTER — Ambulatory Visit: Payer: Self-pay | Admitting: Oncology

## 2014-05-01 ENCOUNTER — Ambulatory Visit: Payer: Self-pay | Admitting: Unknown Physician Specialty

## 2014-05-13 ENCOUNTER — Ambulatory Visit: Payer: BC Managed Care – PPO | Admitting: General Surgery

## 2014-05-15 ENCOUNTER — Ambulatory Visit: Payer: Self-pay | Admitting: Vascular Surgery

## 2014-05-15 LAB — CBC WITH DIFFERENTIAL/PLATELET
COMMENT - H1-COM2: NORMAL
Eosinophil: 1 %
HCT: 37.7 % (ref 35.0–47.0)
HGB: 12 g/dL (ref 12.0–16.0)
LYMPHS PCT: 26 %
MCH: 29.8 pg (ref 26.0–34.0)
MCHC: 31.8 g/dL — ABNORMAL LOW (ref 32.0–36.0)
MCV: 94 fL (ref 80–100)
MONOS PCT: 2 %
Platelet: 303 10*3/uL (ref 150–440)
RBC: 4.02 10*6/uL (ref 3.80–5.20)
RDW: 14.2 % (ref 11.5–14.5)
SEGMENTED NEUTROPHILS: 71 %
WBC: 7.7 10*3/uL (ref 3.6–11.0)

## 2014-05-15 LAB — PROTIME-INR
INR: 1
PROTHROMBIN TIME: 13.3 s (ref 11.5–14.7)

## 2014-05-15 LAB — HCG, QUANTITATIVE, PREGNANCY: Beta Hcg, Quant.: <1 m[IU]/mL — ABNORMAL LOW

## 2014-05-19 LAB — COMPREHENSIVE METABOLIC PANEL
Albumin: 3.6 g/dL (ref 3.4–5.0)
Alkaline Phosphatase: 96 U/L
Anion Gap: 9 (ref 7–16)
BILIRUBIN TOTAL: 0.9 mg/dL (ref 0.2–1.0)
BUN: 10 mg/dL (ref 7–18)
Calcium, Total: 9.3 mg/dL (ref 8.5–10.1)
Chloride: 103 mmol/L (ref 98–107)
Co2: 28 mmol/L (ref 21–32)
Creatinine: 0.82 mg/dL (ref 0.60–1.30)
EGFR (African American): >60
EGFR (Non-African Amer.): >60
GLUCOSE: 101 mg/dL — AB (ref 65–99)
Osmolality: 279 (ref 275–301)
POTASSIUM: 3.7 mmol/L (ref 3.5–5.1)
SGOT(AST): 11 U/L — ABNORMAL LOW (ref 15–37)
SGPT (ALT): 17 U/L
SODIUM: 140 mmol/L (ref 136–145)
Total Protein: 7.6 g/dL (ref 6.4–8.2)

## 2014-05-19 LAB — CBC CANCER CENTER
Basophil #: 0 x10 3/mm (ref 0.0–0.1)
Basophil %: 0.5 %
Eosinophil #: 0.1 x10 3/mm (ref 0.0–0.7)
Eosinophil %: 0.9 %
HCT: 40.4 % (ref 35.0–47.0)
HGB: 13.1 g/dL (ref 12.0–16.0)
LYMPHS ABS: 2.4 x10 3/mm (ref 1.0–3.6)
Lymphocyte %: 26 %
MCH: 30.4 pg (ref 26.0–34.0)
MCHC: 32.4 g/dL (ref 32.0–36.0)
MCV: 94 fL (ref 80–100)
Monocyte #: 0.4 x10 3/mm (ref 0.2–0.9)
Monocyte %: 4.8 %
Neutrophil #: 6.2 x10 3/mm (ref 1.4–6.5)
Neutrophil %: 67.8 %
Platelet: 313 x10 3/mm (ref 150–440)
RBC: 4.32 10*6/uL (ref 3.80–5.20)
RDW: 14 % (ref 11.5–14.5)
WBC: 9.1 x10 3/mm (ref 3.6–11.0)

## 2014-05-19 LAB — LACTATE DEHYDROGENASE: LDH: 149 U/L (ref 81–246)

## 2014-05-21 LAB — URINALYSIS, COMPLETE
Bilirubin,UR: NEGATIVE
Glucose,UR: 50 mg/dL (ref 0–75)
Ketone: NEGATIVE
Leukocyte Esterase: NEGATIVE
Nitrite: NEGATIVE
Ph: 6 (ref 4.5–8.0)
Protein: 100
RBC,UR: 1154 /HPF (ref 0–5)
Specific Gravity: 1.016 (ref 1.003–1.030)
Squamous Epithelial: 1
WBC UR: 36 /HPF (ref 0–5)

## 2014-05-27 LAB — COMPREHENSIVE METABOLIC PANEL
ALT: 22 U/L
AST: 17 U/L (ref 15–37)
Albumin: 3.2 g/dL — ABNORMAL LOW (ref 3.4–5.0)
Alkaline Phosphatase: 88 U/L
Anion Gap: 5 — ABNORMAL LOW (ref 7–16)
BUN: 13 mg/dL (ref 7–18)
Bilirubin,Total: 0.5 mg/dL (ref 0.2–1.0)
CO2: 29 mmol/L (ref 21–32)
Calcium, Total: 8.8 mg/dL (ref 8.5–10.1)
Chloride: 102 mmol/L (ref 98–107)
Creatinine: 0.77 mg/dL (ref 0.60–1.30)
Glucose: 87 mg/dL (ref 65–99)
OSMOLALITY: 271 (ref 275–301)
Potassium: 3.7 mmol/L (ref 3.5–5.1)
Sodium: 136 mmol/L (ref 136–145)
Total Protein: 6.7 g/dL (ref 6.4–8.2)

## 2014-05-27 LAB — CBC CANCER CENTER
Basophil #: 0 x10 3/mm (ref 0.0–0.1)
Basophil %: 1.5 %
Eosinophil #: 0 x10 3/mm (ref 0.0–0.7)
Eosinophil %: 1.2 %
HCT: 31.3 % — ABNORMAL LOW (ref 35.0–47.0)
HGB: 10.8 g/dL — ABNORMAL LOW (ref 12.0–16.0)
Lymphocyte #: 1.4 x10 3/mm (ref 1.0–3.6)
Lymphocyte %: 71.2 %
MCH: 31.7 pg (ref 26.0–34.0)
MCHC: 34.5 g/dL (ref 32.0–36.0)
MCV: 92 fL (ref 80–100)
MONO ABS: 0.2 x10 3/mm (ref 0.2–0.9)
Monocyte %: 8.5 %
NEUTROS PCT: 17.6 %
Neutrophil #: 0.3 x10 3/mm — ABNORMAL LOW (ref 1.4–6.5)
PLATELETS: 123 x10 3/mm — AB (ref 150–440)
RBC: 3.4 10*6/uL — ABNORMAL LOW (ref 3.80–5.20)
RDW: 13 % (ref 11.5–14.5)
WBC: 2 x10 3/mm — CL (ref 3.6–11.0)

## 2014-05-27 LAB — PHOSPHORUS: Phosphorus: 3.2 mg/dL (ref 2.5–4.9)

## 2014-05-27 LAB — MAGNESIUM: MAGNESIUM: 2 mg/dL

## 2014-05-31 ENCOUNTER — Ambulatory Visit: Payer: Self-pay | Admitting: Oncology

## 2014-06-01 ENCOUNTER — Encounter: Payer: Self-pay | Admitting: General Surgery

## 2014-06-02 ENCOUNTER — Encounter: Payer: Self-pay | Admitting: *Deleted

## 2014-06-09 LAB — CBC CANCER CENTER
Basophil #: 0 x10 3/mm (ref 0.0–0.1)
Basophil %: 0.6 %
Eosinophil #: 0 x10 3/mm (ref 0.0–0.7)
Eosinophil %: 0.4 %
HCT: 35.8 % (ref 35.0–47.0)
HGB: 11.6 g/dL — AB (ref 12.0–16.0)
LYMPHS ABS: 1.7 x10 3/mm (ref 1.0–3.6)
Lymphocyte %: 33 %
MCH: 30.5 pg (ref 26.0–34.0)
MCHC: 32.5 g/dL (ref 32.0–36.0)
MCV: 94 fL (ref 80–100)
MONO ABS: 0.5 x10 3/mm (ref 0.2–0.9)
MONOS PCT: 9.8 %
NEUTROS PCT: 56.2 %
Neutrophil #: 2.9 x10 3/mm (ref 1.4–6.5)
Platelet: 454 x10 3/mm — ABNORMAL HIGH (ref 150–440)
RBC: 3.81 10*6/uL (ref 3.80–5.20)
RDW: 13.6 % (ref 11.5–14.5)
WBC: 5.2 x10 3/mm (ref 3.6–11.0)

## 2014-06-09 LAB — COMPREHENSIVE METABOLIC PANEL
ALK PHOS: 76 U/L
ALT: 18 U/L
ANION GAP: 4 — AB (ref 7–16)
Albumin: 3.8 g/dL (ref 3.4–5.0)
BILIRUBIN TOTAL: 0.5 mg/dL (ref 0.2–1.0)
BUN: 9 mg/dL (ref 7–18)
CHLORIDE: 109 mmol/L — AB (ref 98–107)
CREATININE: 0.81 mg/dL (ref 0.60–1.30)
Calcium, Total: 8.9 mg/dL (ref 8.5–10.1)
Co2: 30 mmol/L (ref 21–32)
EGFR (African American): >60
EGFR (Non-African Amer.): >60
GLUCOSE: 87 mg/dL (ref 65–99)
OSMOLALITY: 283 (ref 275–301)
POTASSIUM: 4.3 mmol/L (ref 3.5–5.1)
SGOT(AST): 20 U/L (ref 15–37)
SODIUM: 143 mmol/L (ref 136–145)
Total Protein: 7.1 g/dL (ref 6.4–8.2)

## 2014-06-11 LAB — URINALYSIS, COMPLETE
Bilirubin,UR: NEGATIVE
Ketone: NEGATIVE
LEUKOCYTE ESTERASE: NEGATIVE
Nitrite: NEGATIVE
Ph: 5 (ref 4.5–8.0)
Protein: NEGATIVE
RBC,UR: 2 /HPF (ref 0–5)
Specific Gravity: 1.011 (ref 1.003–1.030)

## 2014-06-12 LAB — URINE CULTURE

## 2014-06-30 ENCOUNTER — Ambulatory Visit: Payer: Self-pay | Admitting: Oncology

## 2014-08-26 ENCOUNTER — Ambulatory Visit: Payer: Self-pay | Admitting: Oncology

## 2014-09-08 ENCOUNTER — Ambulatory Visit: Payer: Self-pay | Admitting: Oncology

## 2014-09-29 ENCOUNTER — Ambulatory Visit
Admit: 2014-09-29 | Disposition: A | Discharge status: Home / Self Care | Payer: Self-pay | Attending: Oncology | Admitting: Oncology

## 2014-10-22 LAB — CBC CANCER CENTER
BASOS ABS: 0.1 x10 3/mm (ref 0.0–0.1)
Basophil %: 1.5 %
Eosinophil #: 0.1 x10 3/mm (ref 0.0–0.7)
Eosinophil %: 1.5 %
HCT: 34.7 % — AB (ref 35.0–47.0)
HGB: 12.1 g/dL (ref 12.0–16.0)
LYMPHS PCT: 41.6 %
Lymphocyte #: 2 x10 3/mm (ref 1.0–3.6)
MCH: 34.1 pg — ABNORMAL HIGH (ref 26.0–34.0)
MCHC: 34.8 g/dL (ref 32.0–36.0)
MCV: 98 fL (ref 80–100)
MONOS PCT: 7.7 %
Monocyte #: 0.4 x10 3/mm (ref 0.2–0.9)
NEUTROS ABS: 2.3 x10 3/mm (ref 1.4–6.5)
Neutrophil %: 47.7 %
Platelet: 257 x10 3/mm (ref 150–440)
RBC: 3.54 10*6/uL — ABNORMAL LOW (ref 3.80–5.20)
RDW: 14.3 % (ref 11.5–14.5)
WBC: 4.9 x10 3/mm (ref 3.6–11.0)

## 2014-10-22 LAB — COMPREHENSIVE METABOLIC PANEL
ALT: 12 U/L — AB
AST: 20 U/L
Albumin: 4.5 g/dL
Alkaline Phosphatase: 44 U/L
Anion Gap: 8 (ref 7–16)
BILIRUBIN TOTAL: 1.6 mg/dL — AB
BUN: 10 mg/dL
CO2: 24 mmol/L
Calcium, Total: 9.2 mg/dL
Chloride: 106 mmol/L
Creatinine: 0.84 mg/dL
EGFR (African American): >60
Glucose: 125 mg/dL — ABNORMAL HIGH
Potassium: 3.5 mmol/L
Sodium: 138 mmol/L
Total Protein: 6.6 g/dL

## 2014-10-22 LAB — LACTATE DEHYDROGENASE: LDH: 152 U/L

## 2014-10-30 ENCOUNTER — Ambulatory Visit
Admit: 2014-10-30 | Disposition: A | Discharge status: Home / Self Care | Payer: Self-pay | Attending: Oncology | Admitting: Oncology

## 2014-11-03 ENCOUNTER — Ambulatory Visit
Admit: 2014-11-03 | Disposition: A | Discharge status: Home / Self Care | Payer: Self-pay | Attending: Oncology | Admitting: Oncology

## 2014-11-04 LAB — COMPREHENSIVE METABOLIC PANEL
ALK PHOS: 49 U/L
ALT: 11 U/L — AB
Albumin: 4.6 g/dL
Anion Gap: 6 — ABNORMAL LOW (ref 7–16)
BUN: 11 mg/dL
Bilirubin,Total: 1.4 mg/dL — ABNORMAL HIGH
CREATININE: 0.81 mg/dL
Calcium, Total: 9.5 mg/dL
Chloride: 105 mmol/L
Co2: 25 mmol/L
EGFR (Non-African Amer.): >60
Glucose: 96 mg/dL
POTASSIUM: 3.6 mmol/L
SGOT(AST): 17 U/L
SODIUM: 136 mmol/L
Total Protein: 7 g/dL

## 2014-11-04 LAB — CBC CANCER CENTER
Basophil #: 0 x10 3/mm (ref 0.0–0.1)
Basophil %: 0.7 %
EOS ABS: 0 x10 3/mm (ref 0.0–0.7)
Eosinophil %: 0.9 %
HCT: 37.5 % (ref 35.0–47.0)
HGB: 13.1 g/dL (ref 12.0–16.0)
LYMPHS PCT: 37.1 %
Lymphocyte #: 1.4 x10 3/mm (ref 1.0–3.6)
MCH: 34 pg (ref 26.0–34.0)
MCHC: 34.8 g/dL (ref 32.0–36.0)
MCV: 98 fL (ref 80–100)
Monocyte #: 0.3 x10 3/mm (ref 0.2–0.9)
Monocyte %: 6.7 %
NEUTROS ABS: 2.1 x10 3/mm (ref 1.4–6.5)
Neutrophil %: 54.6 %
PLATELETS: 241 x10 3/mm (ref 150–440)
RBC: 3.84 10*6/uL (ref 3.80–5.20)
RDW: 12.8 % (ref 11.5–14.5)
WBC: 3.9 x10 3/mm (ref 3.6–11.0)

## 2014-11-04 LAB — LACTATE DEHYDROGENASE: LDH: 146 U/L

## 2014-11-21 NOTE — Op Note (Signed)
PATIENT NAME:  Alisha Vincent, Alisha Vincent MR#:  [id number] DATE OF BIRTH:  Dec 17, 1990  DATE OF PROCEDURE:  05/15/2014  PREOPERATIVE DIAGNOSIS:  Hodgkin lymphoma.   POSTOPERATIVE DIAGNOSIS:  Hodgkin lymphoma.  PROCEDURES:  1.  Ultrasound guidance for vascular access, right internal jugular vein.  2.  Fluoroscopic guidance for placement of catheter.  3.  Placement of CT compatible Port-A-Cath, right internal jugular vein.   SURGEON:  Algernon Huxley, MD.  ANESTHESIA:  Local with moderate conscious sedation.   FLUOROSCOPY TIME:  Less than 1 minute.   CONTRAST:  Zero.   ESTIMATED BLOOD LOSS:  25 mL.  INDICATION FOR PROCEDURE: A 24 year old female with recurrent diagnosis of Hodgkin lymphoma.  She needs a Port-a-Cath for chemotherapy and durable venous access. Risks and benefits were discussed. Informed consent was obtained.   DESCRIPTION OF THE PROCEDURE:  The patient was brought to the vascular and interventional radiology suite. The right neck and chest were sterilely prepped and draped, and a sterile surgical field was created. Ultrasound was used to help visualize a patent right internal jugular vein. This was then accessed under direct ultrasound guidance without difficulty with a Seldinger needle and a permanent image was recorded. A J-wire was placed. After skin nick and dilatation, the peel-away sheath was then placed over the wire. I then anesthetized an area under the clavicle approximately 2 fingerbreadths. A transverse incision was created and an inferior pocket was created with electrocautery and blunt dissection. The port was then brought onto the field, placed into the pocket and secured to the chest wall with 2 Prolene sutures. The catheter was connected to the port and tunneled from the subclavicular incision to the access site. Fluoroscopic guidance was used to cut the catheter to an appropriate length. The catheter was then placed through the peel-away sheath and the peel-away sheath was  removed. The catheter tip was parked in excellent location in the initial portion of the right atrium. The pocket was then irrigated with antibiotic-impregnated saline and the wound was closed with a running 3-0 Vicryl and a 4-0 Monocryl. The access incision was closed with a single 4-0 Monocryl. The Huber needle was used to withdraw blood and flush the port with heparinized saline. Dermabond was then placed as a dressing. The patient tolerated the procedure well and was taken to the recovery room in stable condition.    ____________________________ Algernon Huxley, MD jsd:lt D: 05/16/2014 10:56:30 ET T: 05/16/2014 11:19:12 ET JOB#: [id number]  cc: Algernon Huxley, MD, <Dictator> Algernon Huxley MD ELECTRONICALLY SIGNED 05/16/2014 12:39

## 2014-12-01 ENCOUNTER — Other Ambulatory Visit: Payer: Self-pay | Admitting: Oncology

## 2014-12-01 DIAGNOSIS — C8190 Hodgkin lymphoma, unspecified, unspecified site: Secondary | ICD-10-CM

## 2014-12-24 ENCOUNTER — Telehealth: Payer: Self-pay | Admitting: *Deleted

## 2014-12-24 NOTE — Telephone Encounter (Signed)
States she was at an after work event last evening outside playing corn hole, had eaten 3 meals yesterday and felt she had drank plenty of water also and all at once she felt very hot and everything got black and she she was out for 30 seconds. This is the first time anything like this has happened before and she was told to inform md if anything different occurs. She felt dizzy and nauseated and her eyes feel "weird", she still has these symptoms this morning  I spoke with Dr Grayland Ormond who asked she come in for labs today or tomorrow and said she may have been dehydrated. She has asked to come first thing in the morning to [facility] 830 appt given for lab and block for possible IVF made

## 2014-12-25 ENCOUNTER — Inpatient Hospital Stay: Payer: BLUE CROSS/BLUE SHIELD | Attending: Oncology

## 2014-12-25 ENCOUNTER — Inpatient Hospital Stay: Payer: BLUE CROSS/BLUE SHIELD

## 2014-12-25 DIAGNOSIS — Z8571 Personal history of Hodgkin lymphoma: Secondary | ICD-10-CM | POA: Insufficient documentation

## 2014-12-25 DIAGNOSIS — Z452 Encounter for adjustment and management of vascular access device: Secondary | ICD-10-CM | POA: Insufficient documentation

## 2014-12-25 DIAGNOSIS — C8190 Hodgkin lymphoma, unspecified, unspecified site: Secondary | ICD-10-CM

## 2014-12-25 LAB — COMPREHENSIVE METABOLIC PANEL
ALT: 13 U/L — AB (ref 14–54)
ANION GAP: 9 (ref 5–15)
AST: 20 U/L (ref 15–41)
Albumin: 4 g/dL (ref 3.5–5.0)
Alkaline Phosphatase: 65 U/L (ref 38–126)
BILIRUBIN TOTAL: 1.3 mg/dL — AB (ref 0.3–1.2)
BUN: 14 mg/dL (ref 6–20)
CHLORIDE: 108 mmol/L (ref 101–111)
CO2: 23 mmol/L (ref 22–32)
Calcium: 8.9 mg/dL (ref 8.9–10.3)
Creatinine, Ser: 0.75 mg/dL (ref 0.44–1.00)
GFR calc Af Amer: >60 mL/min (ref >60)
GFR calc non Af Amer: >60 mL/min (ref >60)
GLUCOSE: 97 mg/dL (ref 65–99)
POTASSIUM: 4.1 mmol/L (ref 3.5–5.1)
SODIUM: 140 mmol/L (ref 135–145)
Total Protein: 6.6 g/dL (ref 6.5–8.1)

## 2014-12-25 LAB — CBC WITH DIFFERENTIAL/PLATELET
Basophils Absolute: 0.1 10*3/uL (ref 0–0.1)
Basophils Relative: 2 %
EOS ABS: 0.1 10*3/uL (ref 0–0.7)
Eosinophils Relative: 2 %
HCT: 37.8 % (ref 35.0–47.0)
Hemoglobin: 13.2 g/dL (ref 12.0–16.0)
LYMPHS ABS: 1.4 10*3/uL (ref 1.0–3.6)
Lymphocytes Relative: 38 %
MCH: 34.1 pg — ABNORMAL HIGH (ref 26.0–34.0)
MCHC: 34.8 g/dL (ref 32.0–36.0)
MCV: 98 fL (ref 80.0–100.0)
MONOS PCT: 8 %
Monocytes Absolute: 0.3 10*3/uL (ref 0.2–0.9)
NEUTROS PCT: 50 %
Neutro Abs: 2 10*3/uL (ref 1.4–6.5)
Platelets: 243 10*3/uL (ref 150–440)
RBC: 3.86 MIL/uL (ref 3.80–5.20)
RDW: 12.5 % (ref 11.5–14.5)
WBC: 3.8 10*3/uL (ref 3.6–11.0)

## 2014-12-25 LAB — MAGNESIUM: Magnesium: 2 mg/dL (ref 1.7–2.4)

## 2014-12-25 LAB — LACTATE DEHYDROGENASE: LDH: 125 U/L (ref 98–192)

## 2014-12-25 MED ORDER — HEPARIN SOD (PORK) LOCK FLUSH 100 UNIT/ML IV SOLN
500.0000 [IU] | Freq: Once | INTRAVENOUS | Status: AC
  Administered 2014-12-25: 500 [IU] via INTRAVENOUS

## 2014-12-25 MED ORDER — SODIUM CHLORIDE 0.9 % IJ SOLN
10.0000 mL | INTRAMUSCULAR | Status: DC | PRN
  Administered 2014-12-25: 10 mL via INTRAVENOUS
  Filled 2014-12-25: qty 10

## 2015-01-12 ENCOUNTER — Other Ambulatory Visit: Payer: BLUE CROSS/BLUE SHIELD

## 2015-01-26 ENCOUNTER — Inpatient Hospital Stay: Payer: BLUE CROSS/BLUE SHIELD | Attending: Oncology | Admitting: Oncology

## 2015-01-26 ENCOUNTER — Inpatient Hospital Stay: Payer: BLUE CROSS/BLUE SHIELD

## 2015-01-26 ENCOUNTER — Telehealth: Payer: Self-pay | Admitting: *Deleted

## 2015-01-26 VITALS — BP 114/78 | HR 84 | Temp 96.3°F | Resp 16 | Wt 144.6 lb

## 2015-01-26 DIAGNOSIS — Z9481 Bone marrow transplant status: Secondary | ICD-10-CM

## 2015-01-26 DIAGNOSIS — R509 Fever, unspecified: Secondary | ICD-10-CM | POA: Insufficient documentation

## 2015-01-26 DIAGNOSIS — Z79899 Other long term (current) drug therapy: Secondary | ICD-10-CM | POA: Diagnosis not present

## 2015-01-26 DIAGNOSIS — R0981 Nasal congestion: Secondary | ICD-10-CM | POA: Diagnosis not present

## 2015-01-26 DIAGNOSIS — C8190 Hodgkin lymphoma, unspecified, unspecified site: Secondary | ICD-10-CM

## 2015-01-26 DIAGNOSIS — Z8571 Personal history of Hodgkin lymphoma: Secondary | ICD-10-CM | POA: Diagnosis not present

## 2015-01-26 DIAGNOSIS — R531 Weakness: Secondary | ICD-10-CM | POA: Insufficient documentation

## 2015-01-26 DIAGNOSIS — R5383 Other fatigue: Secondary | ICD-10-CM

## 2015-01-26 LAB — COMPREHENSIVE METABOLIC PANEL
ALT: 14 U/L (ref 14–54)
AST: 16 U/L (ref 15–41)
Albumin: 4.4 g/dL (ref 3.5–5.0)
Alkaline Phosphatase: 73 U/L (ref 38–126)
Anion gap: 6 (ref 5–15)
BILIRUBIN TOTAL: 1.3 mg/dL — AB (ref 0.3–1.2)
BUN: 17 mg/dL (ref 6–20)
CO2: 25 mmol/L (ref 22–32)
CREATININE: 0.86 mg/dL (ref 0.44–1.00)
Calcium: 8.4 mg/dL — ABNORMAL LOW (ref 8.9–10.3)
Chloride: 105 mmol/L (ref 101–111)
GFR calc non Af Amer: >60 mL/min (ref >60)
GLUCOSE: 90 mg/dL (ref 65–99)
Potassium: 3.5 mmol/L (ref 3.5–5.1)
SODIUM: 136 mmol/L (ref 135–145)
TOTAL PROTEIN: 7.1 g/dL (ref 6.5–8.1)

## 2015-01-26 LAB — CBC WITH DIFFERENTIAL/PLATELET
BASOS ABS: 0 10*3/uL (ref 0–0.1)
Basophils Relative: 1 %
Eosinophils Absolute: 0.1 10*3/uL (ref 0–0.7)
Eosinophils Relative: 2 %
HEMATOCRIT: 41.4 % (ref 35.0–47.0)
HEMOGLOBIN: 13.9 g/dL (ref 12.0–16.0)
Lymphocytes Relative: 32 %
Lymphs Abs: 1.6 10*3/uL (ref 1.0–3.6)
MCH: 33 pg (ref 26.0–34.0)
MCHC: 33.6 g/dL (ref 32.0–36.0)
MCV: 98.3 fL (ref 80.0–100.0)
MONO ABS: 0.3 10*3/uL (ref 0.2–0.9)
Monocytes Relative: 6 %
NEUTROS ABS: 3 10*3/uL (ref 1.4–6.5)
Neutrophils Relative %: 59 %
Platelets: 270 10*3/uL (ref 150–440)
RBC: 4.21 MIL/uL (ref 3.80–5.20)
RDW: 12.4 % (ref 11.5–14.5)
WBC: 5 10*3/uL (ref 3.6–11.0)

## 2015-01-26 NOTE — Progress Notes (Signed)
Patient started having throat pain for 3 days, body aches and a headache she describes as "pounding" for 2 days.

## 2015-01-26 NOTE — Telephone Encounter (Signed)
Coming in for lab md

## 2015-01-26 NOTE — Telephone Encounter (Signed)
Called pt and got vm, asked that she call me back

## 2015-02-03 NOTE — Progress Notes (Signed)
Alisha Vincent  Telephone:(336) [phone] Fax:(336) [phone]  ID: Alisha Vincent OB: Jul 14, 1991  MR#: [id number]  NUU#:[id number]  Patient Care Team: Rusty Aus, MD as PCP - General (Unknown Physician Specialty) Robert Bellow, MD as Consulting Physician (General Surgery)  CHIEF COMPLAINT:  Chief Complaint  Patient presents with  . Acute Visit    INTERVAL HISTORY: Patient returns to clinic today as an acute add-on with complaints of low-grade fevers, congestion, and general malaise for the past week. Prior to this she felt well and was at her baseline. She has no neurologic complaints. She has a good appetite and denies weight loss. She denies any pain. She has no chest pain or shortness of breath. She denies any nausea, vomiting, constipation, or diarrhea. She has no urinary complaints. Patient offers no further specific complaints today.  REVIEW OF SYSTEMS:   Review of Systems  Constitutional: Positive for fever and malaise/fatigue.  HENT: Negative.   Respiratory: Negative.   Neurological: Positive for weakness.    As per HPI. Otherwise, a complete review of systems is negatve.  PAST MEDICAL HISTORY: Past Medical History  Diagnosis Date  . Hodgkin's disease, unspecified type, of lymph nodes of multiple sites   . Encounter for fertility preservation procedure Nov 2012    PAST SURGICAL HISTORY: Past Surgical History  Procedure Laterality Date  . Lymph node biopsy  2012  . Wisdom teeth removal   AGE 44  . Portacath placement  Oct 2012    FAMILY HISTORY No family history on file.     ADVANCED DIRECTIVES:    HEALTH MAINTENANCE: History  Substance Use Topics  . Smoking status: Never Smoker   . Smokeless tobacco: Never Used  . Alcohol Use: Yes     Colonoscopy:  PAP:  Bone density:  Lipid panel:  Allergies  Allergen Reactions  . Tape Rash    Current Outpatient Prescriptions  Medication Sig Dispense Refill  . dexmethylphenidate  (FOCALIN XR) 20 MG 24 hr capsule Take by mouth.    Marland Kitchen ibuprofen (ADVIL,MOTRIN) 200 MG tablet Take by mouth.    . SUMAtriptan (IMITREX) 100 MG tablet Take 100 mg by mouth.    . valACYclovir (VALTREX) 500 MG tablet Take 500 mg by mouth.     No current facility-administered medications for this visit.    OBJECTIVE: Filed Vitals:   01/26/15 1603  BP: 114/78  Pulse: 84  Temp: 96.3 F (35.7 C)  Resp: 16     Body mass index is 23.35 kg/(m^2).    ECOG FS:0 - Asymptomatic  General: Well-developed, well-nourished, no acute distress. Eyes: Pink conjunctiva, anicteric sclera. HEENT: Normocephalic, moist mucous membranes, clear oropharnyx. No palpable lymphadenopathy. Lungs: Clear to auscultation bilaterally. Heart: Regular rate and rhythm. No rubs, murmurs, or gallops. Abdomen: Soft, nontender, nondistended. No organomegaly noted, normoactive bowel sounds. Musculoskeletal: No edema, cyanosis, or clubbing. Neuro: Alert, answering all questions appropriately. Cranial nerves grossly intact. Skin: No rashes or petechiae noted. Psych: Normal affect.   LAB RESULTS:  Lab Results  Component Value Date   NA 136 01/26/2015   K 3.5 01/26/2015   CL 105 01/26/2015   CO2 25 01/26/2015   GLUCOSE 90 01/26/2015   BUN 17 01/26/2015   CREATININE 0.86 01/26/2015   CALCIUM 8.4* 01/26/2015   PROT 7.1 01/26/2015   ALBUMIN 4.4 01/26/2015   AST 16 01/26/2015   ALT 14 01/26/2015   ALKPHOS 73 01/26/2015   BILITOT 1.3* 01/26/2015   GFRNONAA >60 01/26/2015   GFRAA >  60 01/26/2015    Lab Results  Component Value Date   WBC 5.0 01/26/2015   NEUTROABS 3.0 01/26/2015   HGB 13.9 01/26/2015   HCT 41.4 01/26/2015   MCV 98.3 01/26/2015   PLT 270 01/26/2015     STUDIES: No results found.  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  PLAN:    1.  Hodgkin disease: CT scan results from November 03, 2014 reviewed independently with no evidence of  recurrence. Patient is now in complete remission after transplant in January 2016. Continue prophylactic antiviral and anti-biotics as prescribed by her [facility] transplant team. After lengthy discussion, patient did not receive maintenance brentuximab. She has been instructed to keep her previously scheduled follow-up with her transplant team as well as with the [facility]. 2. Fevers/malaise: Likely viral syndrome. Patient admits she has not taken her prophylactic antiviral for approximately one week.  She has agreed to refill her prescription today and call the clinic if her symptoms do not resolve in the next 3-7 days.   Dr. Arnell Sieving- Office: [phone], Cell: [phone].  Patient expressed understanding and was in agreement with this plan. She also understands that She can call clinic at any time with any questions, concerns, or complaints.    Lloyd Huger, MD   02/03/2015 8:55 AM

## 2015-02-25 ENCOUNTER — Telehealth: Payer: Self-pay | Admitting: *Deleted

## 2015-02-25 MED ORDER — AMOXICILLIN-POT CLAVULANATE 875-125 MG PO TABS: 1.0000 | ORAL_TABLET | Freq: Two times a day (BID) | ORAL | Status: DC

## 2015-02-25 NOTE — Telephone Encounter (Signed)
Having sinus pressure at cheek bones and pressure at her eyes. Throat is sore and she is losing her voice. Asking that something be called in to phaarmacy in [place]

## 2015-02-25 NOTE — Telephone Encounter (Signed)
Per Dr Grayland Ormond, Augmentin 875 bid times 7 days. I called patient who asked it be called in to [facility] on [address] in [place]. [facility]

## 2015-03-03 ENCOUNTER — Inpatient Hospital Stay: Payer: BLUE CROSS/BLUE SHIELD | Admitting: Oncology

## 2015-03-03 ENCOUNTER — Inpatient Hospital Stay: Payer: BLUE CROSS/BLUE SHIELD | Attending: Oncology

## 2015-04-08 ENCOUNTER — Inpatient Hospital Stay: Payer: BLUE CROSS/BLUE SHIELD | Attending: Oncology

## 2015-04-08 DIAGNOSIS — Z452 Encounter for adjustment and management of vascular access device: Secondary | ICD-10-CM | POA: Insufficient documentation

## 2015-04-08 DIAGNOSIS — C8190 Hodgkin lymphoma, unspecified, unspecified site: Secondary | ICD-10-CM | POA: Insufficient documentation

## 2015-04-08 DIAGNOSIS — C801 Malignant (primary) neoplasm, unspecified: Secondary | ICD-10-CM

## 2015-04-08 MED ORDER — SODIUM CHLORIDE 0.9 % IJ SOLN
10.0000 mL | INTRAMUSCULAR | Status: DC | PRN
  Administered 2015-04-08: 10 mL via INTRAVENOUS
  Filled 2015-04-08: qty 10

## 2015-04-08 MED ORDER — HEPARIN SOD (PORK) LOCK FLUSH 100 UNIT/ML IV SOLN
INTRAVENOUS | Status: AC
  Filled 2015-04-08: qty 5

## 2015-04-08 MED ORDER — HEPARIN SOD (PORK) LOCK FLUSH 100 UNIT/ML IV SOLN
500.0000 [IU] | Freq: Once | INTRAVENOUS | Status: AC
  Administered 2015-04-08: 500 [IU] via INTRAVENOUS

## 2015-07-07 ENCOUNTER — Inpatient Hospital Stay: Payer: BLUE CROSS/BLUE SHIELD

## 2015-07-07 ENCOUNTER — Inpatient Hospital Stay: Payer: BLUE CROSS/BLUE SHIELD | Admitting: Oncology

## 2015-07-20 ENCOUNTER — Inpatient Hospital Stay (HOSPITAL_BASED_OUTPATIENT_CLINIC_OR_DEPARTMENT_OTHER): Payer: BLUE CROSS/BLUE SHIELD | Admitting: Oncology

## 2015-07-20 ENCOUNTER — Inpatient Hospital Stay: Payer: BLUE CROSS/BLUE SHIELD | Attending: Oncology

## 2015-07-20 VITALS — BP 116/79 | HR 76 | Temp 98.0°F | Resp 16 | Wt 157.8 lb

## 2015-07-20 DIAGNOSIS — Z9481 Bone marrow transplant status: Secondary | ICD-10-CM

## 2015-07-20 DIAGNOSIS — Z8571 Personal history of Hodgkin lymphoma: Secondary | ICD-10-CM | POA: Diagnosis present

## 2015-07-20 DIAGNOSIS — Z79899 Other long term (current) drug therapy: Secondary | ICD-10-CM

## 2015-07-20 DIAGNOSIS — Z95828 Presence of other vascular implants and grafts: Secondary | ICD-10-CM

## 2015-07-20 DIAGNOSIS — C8190 Hodgkin lymphoma, unspecified, unspecified site: Secondary | ICD-10-CM

## 2015-07-20 LAB — CBC WITH DIFFERENTIAL/PLATELET
BASOS ABS: 0 10*3/uL (ref 0–0.1)
Basophils Relative: 0 %
Eosinophils Absolute: 0.1 10*3/uL (ref 0–0.7)
Eosinophils Relative: 2 %
HCT: 39 % (ref 35.0–47.0)
Hemoglobin: 13.3 g/dL (ref 12.0–16.0)
LYMPHS PCT: 23 %
Lymphs Abs: 1.6 10*3/uL (ref 1.0–3.6)
MCH: 33.5 pg (ref 26.0–34.0)
MCHC: 34 g/dL (ref 32.0–36.0)
MCV: 98.3 fL (ref 80.0–100.0)
MONO ABS: 0.5 10*3/uL (ref 0.2–0.9)
MONOS PCT: 7 %
Neutro Abs: 5 10*3/uL (ref 1.4–6.5)
Neutrophils Relative %: 68 %
Platelets: 272 10*3/uL (ref 150–440)
RBC: 3.96 MIL/uL (ref 3.80–5.20)
RDW: 13.2 % (ref 11.5–14.5)
WBC: 7.2 10*3/uL (ref 3.6–11.0)

## 2015-07-20 LAB — COMPREHENSIVE METABOLIC PANEL
ALT: 14 U/L (ref 14–54)
AST: 20 U/L (ref 15–41)
Albumin: 3.8 g/dL (ref 3.5–5.0)
Alkaline Phosphatase: 73 U/L (ref 38–126)
Anion gap: 7 (ref 5–15)
BUN: 8 mg/dL (ref 6–20)
CHLORIDE: 106 mmol/L (ref 101–111)
CO2: 24 mmol/L (ref 22–32)
Calcium: 8.6 mg/dL — ABNORMAL LOW (ref 8.9–10.3)
Creatinine, Ser: 0.76 mg/dL (ref 0.44–1.00)
GFR calc Af Amer: >60 mL/min (ref >60)
GFR calc non Af Amer: >60 mL/min (ref >60)
Glucose, Bld: 76 mg/dL (ref 65–99)
Potassium: 3.4 mmol/L — ABNORMAL LOW (ref 3.5–5.1)
Sodium: 137 mmol/L (ref 135–145)
TOTAL PROTEIN: 6.9 g/dL (ref 6.5–8.1)
Total Bilirubin: 0.4 mg/dL (ref 0.3–1.2)

## 2015-07-20 LAB — MAGNESIUM: Magnesium: 2 mg/dL (ref 1.7–2.4)

## 2015-07-20 LAB — LACTATE DEHYDROGENASE: LDH: 127 U/L (ref 98–192)

## 2015-07-20 MED ORDER — SODIUM CHLORIDE 0.9 % IJ SOLN
10.0000 mL | INTRAMUSCULAR | Status: DC | PRN
  Administered 2015-07-20: 10 mL via INTRAVENOUS
  Filled 2015-07-20: qty 10

## 2015-07-20 MED ORDER — HEPARIN SOD (PORK) LOCK FLUSH 100 UNIT/ML IV SOLN
500.0000 [IU] | Freq: Once | INTRAVENOUS | Status: AC
  Administered 2015-07-20: 500 [IU] via INTRAVENOUS
  Filled 2015-07-20: qty 5

## 2015-08-01 NOTE — Progress Notes (Signed)
[place]  [phone] Fax:(336) [phone]  ID: Alisha Vincent OB: Apr 11, 1991  MR#: [id number]  [id number]  Patient Care Team: Rusty Aus, MD as PCP - General (Unknown Physician Specialty) Robert Bellow, MD as Consulting Physician (General Surgery)  CHIEF COMPLAINT:  Chief Complaint  Patient presents with  . Lymphoma    INTERVAL HISTORY: Patient last evaluated in clinic in June 2016. She returns to clinic today for routine lab work and further evaluation. She currently feels well and is asymptomatic. She has no neurologic complaints. She denies any fevers, night sweats, or weight loss. She denies any pain. She has no chest pain or shortness of breath. She denies any nausea, vomiting, constipation, or diarrhea. She has no urinary complaints. Patient offers no specific complaints today.  REVIEW OF SYSTEMS:   Review of Systems  Constitutional: Negative for fever, weight loss and malaise/fatigue.  HENT: Negative.   Respiratory: Negative.   Cardiovascular: Negative.   Musculoskeletal: Negative.   Neurological: Negative.  Negative for weakness.  Endo/Heme/Allergies: Does not bruise/bleed easily.    As per HPI. Otherwise, a complete review of systems is negatve.  PAST MEDICAL HISTORY: Past Medical History  Diagnosis Date  . Hodgkin's disease, unspecified type, of lymph nodes of multiple sites   . Encounter for fertility preservation procedure Nov 2012    PAST SURGICAL HISTORY: Past Surgical History  Procedure Laterality Date  . Lymph node biopsy  2012  . Wisdom teeth removal   AGE 64  . Portacath placement  Oct 2012    FAMILY HISTORY No family history on file.     ADVANCED DIRECTIVES:    HEALTH MAINTENANCE: Social History  Substance Use Topics  . Smoking status: Never Smoker   . Smokeless tobacco: Never Used  . Alcohol Use: Yes     Colonoscopy:  PAP:  Bone density:  Lipid panel:  Allergies  Allergen Reactions  .  Tape Rash    Current Outpatient Prescriptions  Medication Sig Dispense Refill  . ibuprofen (ADVIL,MOTRIN) 200 MG tablet Take by mouth.    . SUMAtriptan (IMITREX) 100 MG tablet Take 100 mg by mouth.    . valACYclovir (VALTREX) 500 MG tablet Take 500 mg by mouth.    . dexmethylphenidate (FOCALIN XR) 20 MG 24 hr capsule Take by mouth.     Current Facility-Administered Medications  Medication Dose Route Frequency Provider Last Rate Last Dose  . sodium chloride 0.9 % injection 10 mL  10 mL Intravenous PRN Lloyd Huger, MD   10 mL at 07/20/15 1043    OBJECTIVE: Filed Vitals:   07/20/15 0955  BP: 116/79  Pulse: 76  Temp: 98 F (36.7 C)  Resp: 16     Body mass index is 25.49 kg/(m^2).    ECOG FS:0 - Asymptomatic  General: Well-developed, well-nourished, no acute distress. Eyes: Pink conjunctiva, anicteric sclera. HEENT: Normocephalic, moist mucous membranes, clear oropharnyx. No palpable lymphadenopathy. Lungs: Clear to auscultation bilaterally. Heart: Regular rate and rhythm. No rubs, murmurs, or gallops. Abdomen: Soft, nontender, nondistended. No organomegaly noted, normoactive bowel sounds. Musculoskeletal: No edema, cyanosis, or clubbing. Neuro: Alert, answering all questions appropriately. Cranial nerves grossly intact. Skin: No rashes or petechiae noted. Psych: Normal affect.   LAB RESULTS:  Lab Results  Component Value Date   NA 137 07/20/2015   K 3.4* 07/20/2015   CL 106 07/20/2015   CO2 24 07/20/2015   GLUCOSE 76 07/20/2015   BUN 8 07/20/2015   CREATININE 0.76 07/20/2015  CALCIUM 8.6* 07/20/2015   PROT 6.9 07/20/2015   ALBUMIN 3.8 07/20/2015   AST 20 07/20/2015   ALT 14 07/20/2015   ALKPHOS 73 07/20/2015   BILITOT 0.4 07/20/2015   GFRNONAA >60 07/20/2015   GFRAA >60 07/20/2015    Lab Results  Component Value Date   WBC 7.2 07/20/2015   NEUTROABS 5.0 07/20/2015   HGB 13.3 07/20/2015   HCT 39.0 07/20/2015   MCV 98.3 07/20/2015   PLT 272  07/20/2015     STUDIES: No results found.  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  PLAN:    1.  Hodgkin disease: CT scan results from November 03, 2014 reviewed independently with no evidence of recurrence. Patient is now in complete remission after transplant in January 2016. Continue prophylactic Valtrex as prescribed by her [facility] transplant team. After lengthy discussion, patient did not receive maintenance brentuximab. Patient has been instructed to continue her vaccines as dictated by the transplant team. Will reimage with CT scan in January 2017. 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  Approximately 30 minutes was spent in discussion of which greater than 50% was consultation.   Transplant physician: Dr. Arnell Sieving- Office: [phone], Cell: [phone].  Patient expressed understanding and was in agreement with this plan. She also understands that She can call clinic at any time with any questions, concerns, or complaints.    Lloyd Huger, MD   08/01/2015 8:27 AM

## 2015-08-30 ENCOUNTER — Ambulatory Visit: Payer: BLUE CROSS/BLUE SHIELD

## 2016-03-13 NOTE — Progress Notes (Signed)
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 Now in remission.  INTERVAL HISTORY: Patient returns to clinic today for repeat laboratory work and further evaluation. She continues to feel well and remains asymptomatic. She has no neurologic complaints. She denies any fevers, night sweats, or weight loss. She denies any pain. She has no chest pain or shortness of breath. She denies any nausea, vomiting, constipation, or diarrhea. She has no urinary complaints. Patient offers no specific complaints today.  REVIEW OF SYSTEMS:   Review of Systems  Constitutional: Negative for fever, malaise/fatigue and weight loss.  HENT: Negative.   Respiratory: Negative.   Cardiovascular: Negative.   Musculoskeletal: Negative.   Neurological: Negative.  Negative for weakness.  Endo/Heme/Allergies: Does not bruise/bleed easily.    As per HPI. Otherwise, a complete review of systems is negatve.  PAST MEDICAL HISTORY: Past Medical History:  Diagnosis Date  . Encounter for fertility preservation procedure Nov 2012  . Hodgkin's disease, unspecified type, of lymph nodes of multiple sites     PAST SURGICAL HISTORY: Past Surgical History:  Procedure Laterality Date  . LYMPH NODE BIOPSY  2012  . [facility] PLACEMENT  Oct 2012  . wisdom teeth removal   AGE 59    FAMILY HISTORY: Reviewed and unchanged. No reported history of malignancy or chronic disease.     ADVANCED DIRECTIVES:    HEALTH MAINTENANCE: Social History  Substance Use Topics  . Smoking status: Never Smoker  . Smokeless tobacco: Never Used  . Alcohol use Yes      Colonoscopy:  PAP:  Bone density:  Lipid panel:  Allergies  Allergen Reactions  . Tape Rash    Current Outpatient Prescriptions  Medication Sig Dispense Refill  . dexmethylphenidate (FOCALIN XR) 20 MG 24 hr capsule Take by mouth.    Marland Kitchen ibuprofen (ADVIL,MOTRIN) 200 MG tablet Take by mouth.    . SUMAtriptan (IMITREX) 100 MG tablet Take 100 mg by mouth.     No current facility-administered medications for this visit.     OBJECTIVE: Vitals:   03/14/16 1104  BP: 106/71  Pulse: 60  Resp: 18  Temp: 97.1 F (36.2 C)     Body mass index is 26.4 kg/m.    ECOG FS:0 - Asymptomatic  General: Well-developed, well-nourished, no acute distress. Eyes: Pink conjunctiva, anicteric sclera. HEENT: Normocephalic, moist mucous membranes, clear oropharnyx. No palpable lymphadenopathy. Lungs: Clear to auscultation bilaterally. Heart: Regular rate and rhythm. No rubs, murmurs, or gallops. Abdomen: Soft, nontender, nondistended. No organomegaly noted, normoactive bowel sounds. Musculoskeletal: No edema, cyanosis, or clubbing. Neuro: Alert, answering all questions appropriately. Cranial nerves grossly intact. Skin: No rashes or petechiae noted. Psych: Normal affect.   LAB RESULTS:  Lab Results  Component Value Date   NA 135 03/14/2016   K 3.7 03/14/2016   CL 108 03/14/2016   CO2 22 03/14/2016   GLUCOSE 94 03/14/2016   BUN 13 03/14/2016   CREATININE 0.75 03/14/2016   CALCIUM 8.6 (L) 03/14/2016   PROT 6.8 03/14/2016   ALBUMIN 4.3 03/14/2016   AST 17 03/14/2016   ALT 12 (L) 03/14/2016   ALKPHOS 55 03/14/2016   BILITOT 1.7 (H) 03/14/2016   GFRNONAA >60 03/14/2016  GFRAA >60 03/14/2016    Lab Results  Component Value Date   WBC 6.3 03/14/2016   NEUTROABS 3.4 03/14/2016   HGB 13.1 03/14/2016   HCT 36.8 03/14/2016   MCV 96.0 03/14/2016   PLT 235 03/14/2016     STUDIES: No results found.  ASSESSMENT: Recurrent Hodgkin's disease, status post bone marrow transplant on  August 03, 2014. Now in remission.  PLAN:    1.  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 Now in remission: CT scan results from November 03, 2014 reviewed independently with no evidence of recurrence. Patient is now in complete remission after transplant in January 2016. After lengthy discussion, patient did not receive maintenance brentuximab. Patient has been instructed to continue her vaccines as dictated by the transplant team. Patient is starting a new job in the next 1-2 weeks and will be under new insurance. Therefore she has requested any additional repeat imaging until this occurs. Continue follow-up with bone marrow transplant in 6 months and then return to clinic in 1 year for repeat laboratory work and further evaluation.    Transplant physician: Dr. Arnell Sieving- Office: [phone], Cell: [phone].  Patient expressed understanding and was in agreement with this plan. She also understands that She can call clinic at any time with any questions, concerns, or complaints.    Lloyd Huger, MD   03/16/2016 3:06 PM

## 2016-03-14 ENCOUNTER — Encounter (INDEPENDENT_AMBULATORY_CARE_PROVIDER_SITE_OTHER): Payer: Self-pay

## 2016-03-14 ENCOUNTER — Inpatient Hospital Stay: Payer: BLUE CROSS/BLUE SHIELD

## 2016-03-14 ENCOUNTER — Inpatient Hospital Stay: Payer: BLUE CROSS/BLUE SHIELD | Attending: Oncology | Admitting: Oncology

## 2016-03-14 VITALS — BP 106/71 | HR 60 | Temp 97.1°F | Resp 18 | Wt 163.6 lb

## 2016-03-14 DIAGNOSIS — C8118 Nodular sclerosis classical Hodgkin lymphoma, lymph nodes of multiple sites: Secondary | ICD-10-CM

## 2016-03-14 DIAGNOSIS — Z9481 Bone marrow transplant status: Secondary | ICD-10-CM

## 2016-03-14 DIAGNOSIS — Z79899 Other long term (current) drug therapy: Secondary | ICD-10-CM | POA: Insufficient documentation

## 2016-03-14 DIAGNOSIS — Z8571 Personal history of Hodgkin lymphoma: Secondary | ICD-10-CM

## 2016-03-14 DIAGNOSIS — Z95828 Presence of other vascular implants and grafts: Secondary | ICD-10-CM

## 2016-03-14 LAB — CBC WITH DIFFERENTIAL/PLATELET
BASOS ABS: 0 10*3/uL (ref 0–0.1)
BASOS PCT: 1 %
EOS ABS: 0.1 10*3/uL (ref 0–0.7)
EOS PCT: 2 %
HCT: 36.8 % (ref 35.0–47.0)
Hemoglobin: 13.1 g/dL (ref 12.0–16.0)
LYMPHS PCT: 36 %
Lymphs Abs: 2.3 10*3/uL (ref 1.0–3.6)
MCH: 34.2 pg — ABNORMAL HIGH (ref 26.0–34.0)
MCHC: 35.6 g/dL (ref 32.0–36.0)
MCV: 96 fL (ref 80.0–100.0)
MONO ABS: 0.5 10*3/uL (ref 0.2–0.9)
Monocytes Relative: 7 %
Neutro Abs: 3.4 10*3/uL (ref 1.4–6.5)
Neutrophils Relative %: 54 %
PLATELETS: 235 10*3/uL (ref 150–440)
RBC: 3.84 MIL/uL (ref 3.80–5.20)
RDW: 13 % (ref 11.5–14.5)
WBC: 6.3 10*3/uL (ref 3.6–11.0)

## 2016-03-14 LAB — COMPREHENSIVE METABOLIC PANEL
ALT: 12 U/L — AB (ref 14–54)
AST: 17 U/L (ref 15–41)
Albumin: 4.3 g/dL (ref 3.5–5.0)
Alkaline Phosphatase: 55 U/L (ref 38–126)
Anion gap: 5 (ref 5–15)
BUN: 13 mg/dL (ref 6–20)
CHLORIDE: 108 mmol/L (ref 101–111)
CO2: 22 mmol/L (ref 22–32)
Calcium: 8.6 mg/dL — ABNORMAL LOW (ref 8.9–10.3)
Creatinine, Ser: 0.75 mg/dL (ref 0.44–1.00)
GFR calc Af Amer: >60 mL/min (ref >60)
Glucose, Bld: 94 mg/dL (ref 65–99)
POTASSIUM: 3.7 mmol/L (ref 3.5–5.1)
SODIUM: 135 mmol/L (ref 135–145)
TOTAL PROTEIN: 6.8 g/dL (ref 6.5–8.1)
Total Bilirubin: 1.7 mg/dL — ABNORMAL HIGH (ref 0.3–1.2)

## 2016-03-14 LAB — LACTATE DEHYDROGENASE: LDH: 140 U/L (ref 98–192)

## 2016-03-14 MED ORDER — HEPARIN SOD (PORK) LOCK FLUSH 100 UNIT/ML IV SOLN
500.0000 [IU] | Freq: Once | INTRAVENOUS | Status: AC
  Administered 2016-03-14: 500 [IU] via INTRAVENOUS

## 2016-03-14 MED ORDER — SODIUM CHLORIDE 0.9% FLUSH
10.0000 mL | INTRAVENOUS | Status: DC | PRN
  Administered 2016-03-14: 10 mL via INTRAVENOUS
  Filled 2016-03-14: qty 10

## 2016-03-14 NOTE — Progress Notes (Signed)
States is feeling well. Offers no complaints. 

## 2016-05-10 ENCOUNTER — Telehealth: Payer: Self-pay | Admitting: *Deleted

## 2016-05-10 NOTE — Telephone Encounter (Signed)
Called to report that she is having symptoms she is concerned about. Asking for [place] to check labs and do a scan. Phantom pain in back and shoulder blades, low grade fever intermittently 99.1. Fatigue constantly, has had a menstrual cycle for past 3 weeks. Has not had any labs since first of August.

## 2016-05-10 NOTE — Telephone Encounter (Signed)
Per Dr Grayland Ormond, CBC, CMP, if no scan in last 6 months then repeat scan CAPN. Patient agrees to come in  For lab 10/13 @ 815 and agrees to have Scan done. Message sent to scheduling.

## 2016-05-12 ENCOUNTER — Ambulatory Visit: Payer: BLUE CROSS/BLUE SHIELD

## 2016-05-12 ENCOUNTER — Inpatient Hospital Stay: Payer: BLUE CROSS/BLUE SHIELD | Attending: Oncology

## 2016-05-12 DIAGNOSIS — Z8571 Personal history of Hodgkin lymphoma: Secondary | ICD-10-CM | POA: Insufficient documentation

## 2016-05-12 DIAGNOSIS — C8118 Nodular sclerosis classical Hodgkin lymphoma, lymph nodes of multiple sites: Secondary | ICD-10-CM

## 2016-05-12 LAB — CBC WITH DIFFERENTIAL/PLATELET
Basophils Absolute: 0 10*3/uL (ref 0–0.1)
Basophils Relative: 1 %
Eosinophils Absolute: 0.1 10*3/uL (ref 0–0.7)
Eosinophils Relative: 2 %
HEMATOCRIT: 39.8 % (ref 35.0–47.0)
HEMOGLOBIN: 13.6 g/dL (ref 12.0–16.0)
LYMPHS ABS: 1.8 10*3/uL (ref 1.0–3.6)
LYMPHS PCT: 33 %
MCH: 33.4 pg (ref 26.0–34.0)
MCHC: 34.3 g/dL (ref 32.0–36.0)
MCV: 97.5 fL (ref 80.0–100.0)
Monocytes Absolute: 0.4 10*3/uL (ref 0.2–0.9)
Monocytes Relative: 7 %
NEUTROS ABS: 3 10*3/uL (ref 1.4–6.5)
NEUTROS PCT: 57 %
Platelets: 258 10*3/uL (ref 150–440)
RBC: 4.08 MIL/uL (ref 3.80–5.20)
RDW: 12.5 % (ref 11.5–14.5)
WBC: 5.4 10*3/uL (ref 3.6–11.0)

## 2016-05-12 LAB — COMPREHENSIVE METABOLIC PANEL
ALK PHOS: 50 U/L (ref 38–126)
ALT: 14 U/L (ref 14–54)
AST: 16 U/L (ref 15–41)
Albumin: 4.1 g/dL (ref 3.5–5.0)
Anion gap: 5 (ref 5–15)
BUN: 15 mg/dL (ref 6–20)
CALCIUM: 8.8 mg/dL — AB (ref 8.9–10.3)
CO2: 26 mmol/L (ref 22–32)
CREATININE: 0.93 mg/dL (ref 0.44–1.00)
Chloride: 108 mmol/L (ref 101–111)
Glucose, Bld: 97 mg/dL (ref 65–99)
Potassium: 4.4 mmol/L (ref 3.5–5.1)
Sodium: 139 mmol/L (ref 135–145)
Total Bilirubin: 1.3 mg/dL — ABNORMAL HIGH (ref 0.3–1.2)
Total Protein: 6.6 g/dL (ref 6.5–8.1)

## 2016-05-12 LAB — LACTATE DEHYDROGENASE: LDH: 121 U/L (ref 98–192)

## 2016-05-16 ENCOUNTER — Other Ambulatory Visit: Payer: Self-pay | Admitting: *Deleted

## 2016-05-16 ENCOUNTER — Telehealth: Payer: Self-pay | Admitting: Oncology

## 2016-05-16 ENCOUNTER — Ambulatory Visit: Payer: BLUE CROSS/BLUE SHIELD

## 2016-05-16 DIAGNOSIS — C8190 Hodgkin lymphoma, unspecified, unspecified site: Secondary | ICD-10-CM

## 2016-05-16 NOTE — Telephone Encounter (Signed)
Pt has concerns about the recent changes in her health. I instructed pt that your team was in clinic and she would have to wait for a call back. She is also scheduled to see you in clinic on Friday in [place]. Please give pt a call.  Thank you

## 2016-05-16 NOTE — Telephone Encounter (Signed)
Call returned, left vm message for patient.

## 2016-05-17 ENCOUNTER — Ambulatory Visit: Admission: RE | Admit: 2016-05-17 | Payer: BLUE CROSS/BLUE SHIELD | Source: Ambulatory Visit

## 2016-05-17 NOTE — Progress Notes (Deleted)
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 Now in remission.  INTERVAL HISTORY: Patient returns to clinic today for repeat laboratory work and further evaluation. She continues to feel well and remains asymptomatic. She has no neurologic complaints. She denies any fevers, night sweats, or weight loss. She denies any pain. She has no chest pain or shortness of breath. She denies any nausea, vomiting, constipation, or diarrhea. She has no urinary complaints. Patient offers no specific complaints today.  REVIEW OF SYSTEMS:   Review of Systems  Constitutional: Negative for fever, malaise/fatigue and weight loss.  HENT: Negative.   Respiratory: Negative.   Cardiovascular: Negative.   Musculoskeletal: Negative.   Neurological: Negative.  Negative for weakness.  Endo/Heme/Allergies: Does not bruise/bleed easily.    As per HPI. Otherwise, a complete review of systems is negatve.  PAST MEDICAL HISTORY: Past Medical History:  Diagnosis Date  . Encounter for fertility preservation procedure Nov 2012  . Hodgkin's disease, unspecified type, of lymph nodes of multiple sites     PAST SURGICAL HISTORY: Past Surgical History:  Procedure Laterality Date  . LYMPH NODE BIOPSY  2012  . [facility] PLACEMENT  Oct 2012  . wisdom teeth removal   AGE 25    FAMILY HISTORY: Reviewed and unchanged. No reported history of malignancy or chronic disease.     ADVANCED DIRECTIVES:    HEALTH MAINTENANCE: Social History  Substance Use Topics  . Smoking status: Never Smoker  . Smokeless tobacco: Never Used  . Alcohol use Yes      Colonoscopy:  PAP:  Bone density:  Lipid panel:  Allergies  Allergen Reactions  . Tape Rash    Current Outpatient Prescriptions  Medication Sig Dispense Refill  . dexmethylphenidate (FOCALIN XR) 20 MG 24 hr capsule Take by mouth.    Marland Kitchen ibuprofen (ADVIL,MOTRIN) 200 MG tablet Take by mouth.    . SUMAtriptan (IMITREX) 100 MG tablet Take 100 mg by mouth.     No current facility-administered medications for this visit.     OBJECTIVE: There were no vitals filed for this visit.   There is no height or weight on file to calculate BMI.    ECOG FS:0 - Asymptomatic  General: Well-developed, well-nourished, no acute distress. Eyes: Pink conjunctiva, anicteric sclera. HEENT: Normocephalic, moist mucous membranes, clear oropharnyx. No palpable lymphadenopathy. Lungs: Clear to auscultation bilaterally. Heart: Regular rate and rhythm. No rubs, murmurs, or gallops. Abdomen: Soft, nontender, nondistended. No organomegaly noted, normoactive bowel sounds. Musculoskeletal: No edema, cyanosis, or clubbing. Neuro: Alert, answering all questions appropriately. Cranial nerves grossly intact. Skin: No rashes or petechiae noted. Psych: Normal affect.   LAB RESULTS:  Lab Results  Component Value Date   NA 139 05/12/2016   K 4.4 05/12/2016   CL 108 05/12/2016   CO2 26 05/12/2016   GLUCOSE 97 05/12/2016   BUN 15 05/12/2016   CREATININE 0.93 05/12/2016   CALCIUM 8.8 (L) 05/12/2016   PROT 6.6 05/12/2016   ALBUMIN 4.1 05/12/2016   AST 16 05/12/2016   ALT 14 05/12/2016   ALKPHOS 50 05/12/2016   BILITOT 1.3 (H) 05/12/2016   GFRNONAA >60 05/12/2016   GFRAA >60 05/12/2016    Lab Results  Component  Value Date   WBC 5.4 05/12/2016   NEUTROABS 3.0 05/12/2016   HGB 13.6 05/12/2016   HCT 39.8 05/12/2016   MCV 97.5 05/12/2016   PLT 258 05/12/2016     STUDIES: No results found.  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 Now in  remission.  PLAN:    1.  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 Now in remission: CT scan results from November 03, 2014 reviewed independently with no evidence of recurrence. Patient is now in complete remission after transplant in January 2016. After lengthy discussion, patient did not receive maintenance brentuximab. Patient has been instructed to continue her vaccines as dictated by the transplant team. Patient is starting a new job in the next 1-2 weeks and will be under new insurance. Therefore she has requested any additional repeat imaging until this occurs. Continue follow-up with bone marrow transplant in 6 months and then return to clinic in 1 year for repeat laboratory work and further evaluation.    Transplant physician: Dr. Arnell Sieving- Office: [phone], Cell: [phone].  Patient expressed understanding and was in agreement with this plan. She also understands that She can call clinic at any time with any questions, concerns, or complaints.    Lloyd Huger, MD   05/17/2016 11:53 PM

## 2016-05-18 ENCOUNTER — Ambulatory Visit: Payer: BLUE CROSS/BLUE SHIELD

## 2016-05-19 ENCOUNTER — Ambulatory Visit: Payer: BLUE CROSS/BLUE SHIELD | Admitting: Oncology

## 2017-03-09 ENCOUNTER — Other Ambulatory Visit: Payer: Self-pay

## 2017-03-09 DIAGNOSIS — C8118 Nodular sclerosis classical Hodgkin lymphoma, lymph nodes of multiple sites: Secondary | ICD-10-CM

## 2017-03-12 NOTE — Progress Notes (Deleted)
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 Now in remission.  INTERVAL HISTORY: Patient returns to clinic today for repeat laboratory work and further evaluation. She continues to feel well and remains asymptomatic. She has no neurologic complaints. She denies any fevers, night sweats, or weight loss. She denies any pain. She has no chest pain or shortness of breath. She denies any nausea, vomiting, constipation, or diarrhea. She has no urinary complaints. Patient offers no specific complaints today.  REVIEW OF SYSTEMS:   Review of Systems  Constitutional: Negative for fever, malaise/fatigue and weight loss.  HENT: Negative.   Respiratory: Negative.   Cardiovascular: Negative.   Musculoskeletal: Negative.   Neurological: Negative.  Negative for weakness.  Endo/Heme/Allergies: Does not bruise/bleed easily.    As per HPI. Otherwise, a complete review of systems is negatve.  PAST MEDICAL HISTORY: Past Medical History:  Diagnosis Date  . Encounter for fertility preservation procedure Nov 2012  . Hodgkin's disease, unspecified type, of lymph nodes of multiple sites     PAST SURGICAL HISTORY: Past Surgical History:  Procedure Laterality Date  . LYMPH NODE BIOPSY  2012  . [facility] PLACEMENT  Oct 2012  . wisdom teeth removal   AGE 84    FAMILY HISTORY: Reviewed and unchanged. No reported history of malignancy or chronic disease.     ADVANCED DIRECTIVES:    HEALTH MAINTENANCE: Social History  Substance Use Topics  . Smoking status: Never Smoker  . Smokeless tobacco: Never Used  . Alcohol use Yes      Colonoscopy:  PAP:  Bone density:  Lipid panel:  Allergies  Allergen Reactions  . Tape Rash    Current Outpatient Prescriptions  Medication Sig Dispense Refill  . dexmethylphenidate (FOCALIN XR) 20 MG 24 hr capsule Take by mouth.    Marland Kitchen ibuprofen (ADVIL,MOTRIN) 200 MG tablet Take by mouth.    . SUMAtriptan (IMITREX) 100 MG tablet Take 100 mg by mouth.     No current facility-administered medications for this visit.     OBJECTIVE: There were no vitals filed for this visit.   There is no height or weight on file to calculate BMI.    ECOG FS:0 - Asymptomatic  General: Well-developed, well-nourished, no acute distress. Eyes: Pink conjunctiva, anicteric sclera. HEENT: Normocephalic, moist mucous membranes, clear oropharnyx. No palpable lymphadenopathy. Lungs: Clear to auscultation bilaterally. Heart: Regular rate and rhythm. No rubs, murmurs, or gallops. Abdomen: Soft, nontender, nondistended. No organomegaly noted, normoactive bowel sounds. Musculoskeletal: No edema, cyanosis, or clubbing. Neuro: Alert, answering all questions appropriately. Cranial nerves grossly intact. Skin: No rashes or petechiae noted. Psych: Normal affect.   LAB RESULTS:  Lab Results  Component Value Date   NA 139 05/12/2016   K 4.4 05/12/2016   CL 108 05/12/2016   CO2 26 05/12/2016   GLUCOSE 97 05/12/2016   BUN 15 05/12/2016   CREATININE 0.93 05/12/2016   CALCIUM 8.8 (L) 05/12/2016   PROT 6.6 05/12/2016   ALBUMIN 4.1 05/12/2016   AST 16 05/12/2016   ALT 14 05/12/2016   ALKPHOS 50 05/12/2016   BILITOT 1.3 (H) 05/12/2016   GFRNONAA >60 05/12/2016   GFRAA >60 05/12/2016    Lab Results  Component  Value Date   WBC 5.4 05/12/2016   NEUTROABS 3.0 05/12/2016   HGB 13.6 05/12/2016   HCT 39.8 05/12/2016   MCV 97.5 05/12/2016   PLT 258 05/12/2016     STUDIES: No results found.  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 Now in  remission.  PLAN:    1.  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 Now in remission: CT scan results from November 03, 2014 reviewed independently with no evidence of recurrence. Patient is now in complete remission after transplant in January 2016. After lengthy discussion, patient did not receive maintenance brentuximab. Patient has been instructed to continue her vaccines as dictated by the transplant team. Patient is starting a new job in the next 1-2 weeks and will be under new insurance. Therefore she has requested any additional repeat imaging until this occurs. Continue follow-up with bone marrow transplant in 6 months and then return to clinic in 1 year for repeat laboratory work and further evaluation.    Transplant physician: Dr. Arnell Sieving- Office: [phone], Cell: [phone].  Patient expressed understanding and was in agreement with this plan. She also understands that She can call clinic at any time with any questions, concerns, or complaints.    Lloyd Huger, MD   03/12/2017 11:35 PM

## 2017-03-14 ENCOUNTER — Inpatient Hospital Stay: Payer: BC Managed Care – PPO | Admitting: Oncology

## 2017-03-14 ENCOUNTER — Inpatient Hospital Stay: Payer: BC Managed Care – PPO | Attending: Oncology

## 2017-03-14 ENCOUNTER — Inpatient Hospital Stay: Payer: BC Managed Care – PPO

## 2017-03-14 DIAGNOSIS — Z8572 Personal history of non-Hodgkin lymphomas: Secondary | ICD-10-CM | POA: Diagnosis present

## 2017-03-14 DIAGNOSIS — C8118 Nodular sclerosis classical Hodgkin lymphoma, lymph nodes of multiple sites: Secondary | ICD-10-CM

## 2017-03-14 DIAGNOSIS — Z95828 Presence of other vascular implants and grafts: Secondary | ICD-10-CM

## 2017-03-14 LAB — COMPREHENSIVE METABOLIC PANEL
ALBUMIN: 3.9 g/dL (ref 3.5–5.0)
ALT: 14 U/L (ref 14–54)
AST: 18 U/L (ref 15–41)
Alkaline Phosphatase: 57 U/L (ref 38–126)
Anion gap: 7 (ref 5–15)
BUN: 15 mg/dL (ref 6–20)
CHLORIDE: 108 mmol/L (ref 101–111)
CO2: 23 mmol/L (ref 22–32)
CREATININE: 0.88 mg/dL (ref 0.44–1.00)
Calcium: 8.9 mg/dL (ref 8.9–10.3)
GFR calc Af Amer: >60 mL/min (ref >60)
GFR calc non Af Amer: >60 mL/min (ref >60)
Glucose, Bld: 132 mg/dL — ABNORMAL HIGH (ref 65–99)
Potassium: 3.6 mmol/L (ref 3.5–5.1)
SODIUM: 138 mmol/L (ref 135–145)
Total Bilirubin: 1.3 mg/dL — ABNORMAL HIGH (ref 0.3–1.2)
Total Protein: 6.6 g/dL (ref 6.5–8.1)

## 2017-03-14 LAB — CBC WITH DIFFERENTIAL/PLATELET
BASOS ABS: 0.1 10*3/uL (ref 0–0.1)
Basophils Relative: 1 %
EOS ABS: 0.1 10*3/uL (ref 0–0.7)
EOS PCT: 2 %
HCT: 38.2 % (ref 35.0–47.0)
Hemoglobin: 13.3 g/dL (ref 12.0–16.0)
Lymphocytes Relative: 35 %
Lymphs Abs: 1.9 10*3/uL (ref 1.0–3.6)
MCH: 33.4 pg (ref 26.0–34.0)
MCHC: 35 g/dL (ref 32.0–36.0)
MCV: 95.4 fL (ref 80.0–100.0)
Monocytes Absolute: 0.2 10*3/uL (ref 0.2–0.9)
Monocytes Relative: 4 %
Neutro Abs: 3.2 10*3/uL (ref 1.4–6.5)
Neutrophils Relative %: 58 %
Platelets: 180 10*3/uL (ref 150–440)
RBC: 4 MIL/uL (ref 3.80–5.20)
RDW: 12.6 % (ref 11.5–14.5)
WBC: 5.5 10*3/uL (ref 3.6–11.0)

## 2017-03-14 LAB — LACTATE DEHYDROGENASE: LDH: 128 U/L (ref 98–192)

## 2017-03-14 MED ORDER — SODIUM CHLORIDE 0.9% FLUSH
10.0000 mL | INTRAVENOUS | Status: AC | PRN
  Administered 2017-03-14: 10 mL via INTRAVENOUS
  Filled 2017-03-14: qty 10

## 2017-03-14 MED ORDER — HEPARIN SOD (PORK) LOCK FLUSH 100 UNIT/ML IV SOLN
500.0000 [IU] | Freq: Once | INTRAVENOUS | Status: AC
  Administered 2017-03-14: 500 [IU] via INTRAVENOUS

## 2017-04-30 NOTE — Progress Notes (Signed)
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 Now in remission.  INTERVAL HISTORY: Patient returns to clinic today for repeat laboratory work and further evaluation. She was last evaluated in clinic on March 14, 2016. She continues to feel well and remains asymptomatic. She has no neurologic complaints. She denies any fevers, night sweats, or weight loss. She denies any pain. She has no chest pain or shortness of breath. She denies any nausea, vomiting, constipation, or diarrhea. She has no urinary complaints. Patient offers no specific complaints today.  REVIEW OF SYSTEMS:   Review of Systems  Constitutional: Negative for fever, malaise/fatigue and weight loss.  HENT: Negative.   Respiratory: Negative.  Negative for cough and shortness of breath.   Cardiovascular: Negative.  Negative for chest pain and leg swelling.  Gastrointestinal: Negative.  Negative for abdominal pain.  Genitourinary: Negative.   Musculoskeletal: Negative.   Skin: Negative.  Negative for rash.  Neurological: Negative.  Negative for weakness.  Endo/Heme/Allergies: Does not bruise/bleed easily.  Psychiatric/Behavioral: Negative.  The patient is not nervous/anxious.     As per HPI. Otherwise, a complete review of systems is negative.  PAST MEDICAL HISTORY: Past Medical History:  Diagnosis Date  . Encounter for fertility preservation procedure Nov 2012  . Hodgkin's disease, unspecified type, of lymph nodes of multiple sites     PAST SURGICAL HISTORY: Past Surgical History:  Procedure Laterality Date  . LYMPH NODE BIOPSY  2012  . [facility] PLACEMENT  Oct 2012    . wisdom teeth removal   AGE 39    FAMILY HISTORY: Reviewed and unchanged. No reported history of malignancy or chronic disease.     ADVANCED DIRECTIVES:    HEALTH MAINTENANCE: Social History  Substance Use Topics  . Smoking status: Never Smoker  . Smokeless tobacco: Never Used  . Alcohol use Yes     Colonoscopy:  PAP:  Bone density:  Lipid panel:  Allergies  Allergen Reactions  . Tape Rash    Current Outpatient Prescriptions  Medication Sig Dispense Refill  . dexmethylphenidate (FOCALIN) 10 MG tablet Take by mouth.    Marland Kitchen ibuprofen (ADVIL,MOTRIN) 200 MG tablet Take by mouth.    . SUMAtriptan (IMITREX) 100 MG tablet Take 100 mg by mouth.     No current facility-administered medications for this visit.    Facility-Administered Medications Ordered in Other Visits  Medication Dose Route Frequency Provider Last Rate Last Dose  . sodium chloride flush (NS) 0.9 % injection 10 mL  10 mL Intravenous PRN Lloyd Huger, MD   10 mL at 03/14/17 1044    OBJECTIVE: Vitals:   05/02/17 1046  BP: 111/79  Pulse: 67  Resp: 16  Temp: 98 F (36.7 C)     Body mass index is 26.47 kg/m.    ECOG FS:0 - Asymptomatic  General: Well-developed, well-nourished, no acute distress. Eyes: Pink conjunctiva, anicteric sclera. HEENT: Normocephalic, moist mucous membranes, clear oropharnyx. No palpable lymphadenopathy. Lungs: Clear to auscultation bilaterally. Heart: Regular rate and rhythm. No rubs, murmurs, or gallops. Abdomen: Soft, nontender, nondistended. No organomegaly noted, normoactive bowel sounds. Musculoskeletal: No edema, cyanosis, or clubbing. Neuro: Alert, answering all questions appropriately. Cranial nerves grossly intact. Skin: No rashes  or petechiae noted. Psych: Normal affect.   LAB RESULTS:  Lab Results  Component Value Date   NA 138 03/14/2017   K 3.6 03/14/2017   CL 108 03/14/2017   CO2 23 03/14/2017   GLUCOSE 132 (H) 03/14/2017   BUN 15 03/14/2017    CREATININE 0.88 03/14/2017   CALCIUM 8.9 03/14/2017   PROT 6.6 03/14/2017   ALBUMIN 3.9 03/14/2017   AST 18 03/14/2017   ALT 14 03/14/2017   ALKPHOS 57 03/14/2017   BILITOT 1.3 (H) 03/14/2017   GFRNONAA >60 03/14/2017   GFRAA >60 03/14/2017    Lab Results  Component Value Date   WBC 5.5 03/14/2017   NEUTROABS 3.2 03/14/2017   HGB 13.3 03/14/2017   HCT 38.2 03/14/2017   MCV 95.4 03/14/2017   PLT 180 03/14/2017     STUDIES: No results found.  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 Now in remission.  PLAN:    1.  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 She continues to remain in a complete remission. Previously, after lengthy discussion, patient did not receive maintenance brentuximab. Patient has now completed her posttransplant vaccines and continues to follow up yearly with her transplant team. We will get repeat imaging in mid November after she returns from her honeymoon. Return to clinic in 1 year for further evaluation.   Approximately 30 minutes was spent in discussion of which greater than 50% was consultation.   Transplant physician: Dr. Arnell Sieving- Office: [phone], Cell: [phone].  Patient expressed understanding and was in agreement with this plan. She also understands that She can call clinic at any time with any questions, concerns, or complaints.    Lloyd Huger, MD   05/04/2017 8:59 AM

## 2017-05-02 ENCOUNTER — Inpatient Hospital Stay: Payer: BC Managed Care – PPO | Attending: Oncology | Admitting: Oncology

## 2017-05-02 VITALS — BP 111/79 | HR 67 | Temp 98.0°F | Resp 16 | Wt 164.0 lb

## 2017-05-02 DIAGNOSIS — Z8572 Personal history of non-Hodgkin lymphomas: Secondary | ICD-10-CM | POA: Diagnosis not present

## 2017-05-02 DIAGNOSIS — Z9481 Bone marrow transplant status: Secondary | ICD-10-CM

## 2017-05-02 DIAGNOSIS — C8118 Nodular sclerosis classical Hodgkin lymphoma, lymph nodes of multiple sites: Secondary | ICD-10-CM

## 2017-05-02 NOTE — Progress Notes (Signed)
Pt here for follow up of Hodgkins lymphoma.  No compliants

## 2017-06-15 ENCOUNTER — Ambulatory Visit: Payer: BC Managed Care – PPO

## 2017-06-22 ENCOUNTER — Ambulatory Visit
Admission: RE | Admit: 2017-06-22 | Discharge: 2017-06-22 | Disposition: A | Discharge status: Home / Self Care | Payer: BC Managed Care – PPO | Source: Ambulatory Visit | Attending: Oncology | Admitting: Oncology

## 2017-06-22 DIAGNOSIS — C8118 Nodular sclerosis classical Hodgkin lymphoma, lymph nodes of multiple sites: Secondary | ICD-10-CM | POA: Insufficient documentation

## 2017-06-22 MED ORDER — IOPAMIDOL (ISOVUE-300) INJECTION 61%
100.0000 mL | Freq: Once | INTRAVENOUS | Status: AC | PRN
  Administered 2017-06-22: 100 mL via INTRAVENOUS

## 2017-06-25 ENCOUNTER — Telehealth: Payer: Self-pay | Admitting: *Deleted

## 2017-06-25 ENCOUNTER — Other Ambulatory Visit: Payer: Self-pay | Admitting: *Deleted

## 2017-06-25 DIAGNOSIS — C8190 Hodgkin lymphoma, unspecified, unspecified site: Secondary | ICD-10-CM

## 2017-06-25 NOTE — Telephone Encounter (Addendum)
Per VO Dr Grayland Ormond, ok to remove port. She does not remember exactly who put in the last port, did not go to office to see anyone, just went to hospital to have it put in, but Dr Bary Castilla took out her first port and is fine with him removing this one as well. Alisha Vincent will fax order

## 2017-06-25 NOTE — Telephone Encounter (Signed)
Left vm message for patient regarding CT results. Per Dr. Grayland Ormond CT scans looked good. Patient to be scheduled for follow up in 6 months for CT scans and to see Dr. Grayland Ormond.

## 2017-06-25 NOTE — Telephone Encounter (Signed)
Patient informed of CT results and need to repeat in 6 months and FOLLOW UP with doctor. She asked if she can now have her port removed as discussed previously. Asking for a return call regarding this. Please advise

## 2017-06-26 ENCOUNTER — Other Ambulatory Visit: Payer: Self-pay | Admitting: *Deleted

## 2017-06-26 DIAGNOSIS — Z95828 Presence of other vascular implants and grafts: Secondary | ICD-10-CM

## 2017-06-26 NOTE — Telephone Encounter (Signed)
In reviewing chart, it looks like Dr. Lucky Cowboy placed the most recent port in October 2015. I have sent a message to Mickel Baas at Dr. Bunnie Domino office in regards to having port removed.

## 2017-07-05 NOTE — Progress Notes (Signed)
Patient ID: Alisha Vincent, female   DOB: 1991/07/13, 26 y.o.   MRN: [id number]  Chief Complaint  Patient presents with  . New Patient (Initial Visit)    Discuss port removal    HPI Alisha Vincent Part is a 26 y.o. female.  I am asked to see the patient by the [facility] for evaluation of her port for possible removal.  We have not seen her since this was placed well over 3 years ago.  The patient reports having not use this in well over a year.  She is cancer free.  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  She has no complaints today.  She desires to have her port removed.   Past Medical History:  Diagnosis Date  . Encounter for fertility preservation procedure Nov 2012  . Hodgkin's disease, unspecified type, of lymph nodes of multiple sites     Past Surgical History:  Procedure Laterality Date  . LYMPH NODE BIOPSY  2012  . [facility] PLACEMENT  Oct 2012  . wisdom teeth removal   AGE 73    Family History No bleeding or clotting disorders  Social History Social History   Tobacco Use  . Smoking status: Never Smoker  . Smokeless tobacco: Never Used  Substance Use Topics  . Alcohol use: Yes  . Drug use: No     Allergies  Allergen Reactions  . Tape Rash    Current Outpatient Medications  Medication Sig Dispense Refill  . dexmethylphenidate (FOCALIN) 10 MG tablet Take by mouth.    Marland Kitchen ibuprofen (ADVIL,MOTRIN) 200 MG tablet Take by mouth.    . SUMAtriptan (IMITREX) 100 MG tablet Take 100 mg by mouth.     No current facility-administered medications for this visit.    Facility-Administered Medications Ordered in Other Visits  Medication Dose Route Frequency Provider Last Rate Last Dose  . sodium chloride flush (NS) 0.9 % injection 10 mL  10 mL Intravenous PRN Lloyd Huger, MD   10 mL at 03/14/17 1044      REVIEW OF SYSTEMS (Negative unless checked)  Constitutional: [] Weight loss  [] Fever   [] Chills Cardiac: [] Chest pain   [] Chest pressure   [] Palpitations   [] Shortness of breath when laying flat   [] Shortness of breath at rest   [] Shortness of breath with exertion. Vascular:  [] Pain in legs with walking   [] Pain in legs at rest   [] Pain in legs when laying flat   [] Claudication   [] Pain in feet when walking  [] Pain in feet at rest  [] Pain in feet when laying flat   [] History of DVT   [] Phlebitis   [] Swelling in legs   [] Varicose veins   [] Non-healing ulcers Pulmonary:   [] Uses home oxygen   [] Productive cough   [] Hemoptysis   [] Wheeze  [] COPD   [] Asthma Neurologic:  [] Dizziness  [] Blackouts   [] Seizures   [] History of stroke   [] History of TIA  [] Aphasia   [] Temporary blindness   [] Dysphagia   [] Weakness or numbness in arms   [] Weakness or numbness in legs Musculoskeletal:  [] Arthritis   [] Joint swelling   [] Joint pain   [] Low back pain Hematologic:  [] Easy bruising  [] Easy bleeding   [] Hypercoagulable state   [] Anemic  [] Hepatitis Gastrointestinal:  [] Blood in stool   [] Vomiting blood  [] Gastroesophageal reflux/heartburn   [] Abdominal pain Genitourinary:  [] Chronic kidney disease   [] Difficult urination  [] Frequent urination  [] Burning  with urination   [] Hematuria Skin:  [] Rashes   [] Ulcers   [] Wounds Psychological:  [] History of anxiety   []  History of major depression.    Physical Exam BP 109/73 (BP Location: Right Arm, Patient Position: Sitting)   Pulse 62   Resp 15   Ht 5\' 5"  (1.651 m)   Wt 75.8 kg (167 lb)   LMP 06/19/2017 (Exact Date)   BMI 27.79 kg/m  Gen:  WD/WN, NAD Head: [place]/AT, No temporalis wasting.  Ear/Nose/Throat: Hearing grossly intact, nares w/o erythema or drainage, oropharynx w/o Erythema/Exudate Eyes: Conjunctiva clear, sclera non-icteric  Neck: trachea midline.  No JVD.  Pulmonary:  Good air movement, respirations not labored, no use of accessory muscles Cardiac: RRR, no JVD Vascular: Port in right subclavicular location without erythema or  drainage Vessel Right Left  Radial Palpable Palpable                                    Musculoskeletal: M/S 5/5 throughout.  Extremities without ischemic changes.  No deformity or atrophy.  No edema. Neurologic: Sensation grossly intact in extremities.  Symmetrical.  Speech is fluent. Motor exam as listed above. Psychiatric: Judgment intact, Mood & affect appropriate for pt's clinical situation. Dermatologic: No rashes or ulcers noted.  No cellulitis or open wounds.    Radiology Ct Soft Tissue Neck W Contrast  Result Date: 06/22/2017 CLINICAL DATA:  26 year old female restaging Hodgkin lymphoma diagnosed in 2012. Currently asymptomatic. EXAM: CT NECK WITH CONTRAST TECHNIQUE: Multidetector CT imaging of the neck was performed using the standard protocol following the bolus administration of intravenous contrast. CONTRAST:  180mL ISOVUE-300 IOPAMIDOL (ISOVUE-300) INJECTION 61% in conjunction with contrast enhanced imaging of the chest, abdomen, and pelvis reported separately. COMPARISON:  Neck CT 11/03/2014, and earlier. FINDINGS: Pharynx and larynx: Normal laryngeal and pharyngeal soft tissue contours. Mild nasopharyngeal mucosal enhancement may be physiologic. Negative parapharyngeal and retropharyngeal spaces. Salivary glands: Sublingual space, sublingual glands, submandibular glands and parotid glands are within normal limits. Thyroid: Negative. Lymph nodes: 7-8 mm bilateral level IIa lymph nodes have mildly increased since 2016 (6-7 mm previously. Similar small but increased left level IIb nodes individually up to 4 mm short axis. Similar small but increased right level 2 B nodes. Smaller lymph nodes at the junction of the level IIa and IIIa stations also are increased since 2016. Level I, level IV, and level V nodes appear more stable and remain diminutive. The level IIa nodes today appear very similar to the neck CT of 07/16/2013. The bilateral level IIb nodes today are mildly enlarged  compared to 2014. Vascular: Right IJ approach porta cath in place. Major vascular structures in the neck and at the skullbase are patent. Limited intracranial: Negative. Visualized orbits: Negative. Mastoids and visualized paranasal sinuses: Chronic mucosal thickening and/or mucous retention cysts in both maxillary sinuses. Stable paranasal sinuses scratched as stable visible paranasal sinuses. Visible tympanic cavities and mastoids remain clear. Skeleton: Negative. Upper chest: Reported separately today. IMPRESSION: 1. Cervical lymph nodes remain normal by size criteria but have conspicuously increased at the bilateral level II stations since the 2016 neck CT. Still, this could be physiologic - and most nodes today are similar to a 2014 comparison. Recommend short interval clinical exam follow-up of the neck with repeat neck CT if any palpable change occurs. 2.  CT Chest, Abdomen, and Pelvis today are reported separately. Electronically Signed   By: Genevie Ann  M.D.   On: 06/22/2017 09:31   Ct Chest W Contrast  Result Date: 06/22/2017 CLINICAL DATA:  Restaging Hodgkin's lymphoma, diagnosed in 2012, currently asymptomatic EXAM: CT CHEST, ABDOMEN, AND PELVIS WITH CONTRAST TECHNIQUE: Multidetector CT imaging of the chest, abdomen and pelvis was performed following the standard protocol during bolus administration of intravenous contrast. CONTRAST:  120mL ISOVUE-300 IOPAMIDOL (ISOVUE-300) INJECTION 61% COMPARISON:  11/03/2014 FINDINGS: CT CHEST FINDINGS Cardiovascular: The heart is normal in size. No pericardial effusion. No evidence of thoracic aortic aneurysm. Right chest port terminates at the cavoatrial junction. Mediastinum/Nodes: No suspicious mediastinal, hilar, or axillary lymphadenopathy. Visualized thyroid is unremarkable. Lungs/Pleura: Lungs are clear. No suspicious pulmonary nodules. No focal consolidation. No pleural effusion or pneumothorax. Musculoskeletal: Visualized osseous structures are within  normal limits. CT ABDOMEN PELVIS FINDINGS Hepatobiliary: Liver is within normal limits. Gallbladder is unremarkable. No intrahepatic or extrahepatic duct dilatation. Pancreas: Within normal limits. Spleen: Spleen is normal in size. Adrenals/Urinary Tract: Adrenal glands within normal limits. Kidneys are within normal limits.  No hydronephrosis. Bladder is within normal limits. Stomach/Bowel: Stomach is within normal limits. No evidence of bowel obstruction. Normal appendix (series 3/ image 92). Vascular/Lymphatic: No evidence of abdominal aortic aneurysm. No suspicious abdominopelvic lymphadenopathy. Small mesenteric nodes measuring up to 9 mm short axis in the right mid abdomen (coronal image 69), within the upper limits of normal. Reproductive: Uterus is within normal limits. Bilateral ovaries are within normal limits, noting a right corpus luteum. Other: No abdominopelvic ascites. Musculoskeletal: Visualized osseous structures are within normal limits. IMPRESSION: No findings suspicious for active lymphoma in the chest, abdomen, or pelvis. Small mesenteric nodes in the right mid abdomen, within normal limits. Spleen is normal in size. Electronically Signed   By: Julian Hy M.D.   On: 06/22/2017 09:25   Ct Abdomen Pelvis W Contrast  Result Date: 06/22/2017 CLINICAL DATA:  Restaging Hodgkin's lymphoma, diagnosed in 2012, currently asymptomatic EXAM: CT CHEST, ABDOMEN, AND PELVIS WITH CONTRAST TECHNIQUE: Multidetector CT imaging of the chest, abdomen and pelvis was performed following the standard protocol during bolus administration of intravenous contrast. CONTRAST:  175mL ISOVUE-300 IOPAMIDOL (ISOVUE-300) INJECTION 61% COMPARISON:  11/03/2014 FINDINGS: CT CHEST FINDINGS Cardiovascular: The heart is normal in size. No pericardial effusion. No evidence of thoracic aortic aneurysm. Right chest port terminates at the cavoatrial junction. Mediastinum/Nodes: No suspicious mediastinal, hilar, or axillary  lymphadenopathy. Visualized thyroid is unremarkable. Lungs/Pleura: Lungs are clear. No suspicious pulmonary nodules. No focal consolidation. No pleural effusion or pneumothorax. Musculoskeletal: Visualized osseous structures are within normal limits. CT ABDOMEN PELVIS FINDINGS Hepatobiliary: Liver is within normal limits. Gallbladder is unremarkable. No intrahepatic or extrahepatic duct dilatation. Pancreas: Within normal limits. Spleen: Spleen is normal in size. Adrenals/Urinary Tract: Adrenal glands within normal limits. Kidneys are within normal limits.  No hydronephrosis. Bladder is within normal limits. Stomach/Bowel: Stomach is within normal limits. No evidence of bowel obstruction. Normal appendix (series 3/ image 92). Vascular/Lymphatic: No evidence of abdominal aortic aneurysm. No suspicious abdominopelvic lymphadenopathy. Small mesenteric nodes measuring up to 9 mm short axis in the right mid abdomen (coronal image 69), within the upper limits of normal. Reproductive: Uterus is within normal limits. Bilateral ovaries are within normal limits, noting a right corpus luteum. Other: No abdominopelvic ascites. Musculoskeletal: Visualized osseous structures are within normal limits. IMPRESSION: No findings suspicious for active lymphoma in the chest, abdomen, or pelvis. Small mesenteric nodes in the right mid abdomen, within normal limits. Spleen is normal in size. Electronically Signed   By: Bertis Ruddy  [place] Pink M.D.   On: 06/22/2017 09:25    Labs No results found for this or any previous visit (from the past 2160 hour(s)).  Assessment/Plan:  Hodgkin's lymphoma ([place]) The patient has completed therapy with no evidence of disease that would require treatment at this time.  She desires to have her port removed.  Risks and benefits were discussed and this will be removed at her convenience later this month.      Leotis Pain 07/06/2017, 9:34 AM   This note was created with Dragon medical transcription  system.  Any errors from dictation are unintentional.

## 2017-07-06 ENCOUNTER — Encounter (INDEPENDENT_AMBULATORY_CARE_PROVIDER_SITE_OTHER): Payer: Self-pay | Admitting: Vascular Surgery

## 2017-07-06 ENCOUNTER — Ambulatory Visit (INDEPENDENT_AMBULATORY_CARE_PROVIDER_SITE_OTHER): Payer: BC Managed Care – PPO | Admitting: Vascular Surgery

## 2017-07-06 ENCOUNTER — Encounter (INDEPENDENT_AMBULATORY_CARE_PROVIDER_SITE_OTHER): Payer: Self-pay

## 2017-07-06 VITALS — BP 109/73 | HR 62 | Resp 15 | Ht 65.0 in | Wt 167.0 lb

## 2017-07-06 DIAGNOSIS — C8118 Nodular sclerosis classical Hodgkin lymphoma, lymph nodes of multiple sites: Secondary | ICD-10-CM | POA: Diagnosis not present

## 2017-07-06 NOTE — Assessment & Plan Note (Signed)
The patient has completed therapy with no evidence of disease that would require treatment at this time.  She desires to have her port removed.  Risks and benefits were discussed and this will be removed at her convenience later this month.

## 2017-07-10 ENCOUNTER — Other Ambulatory Visit (INDEPENDENT_AMBULATORY_CARE_PROVIDER_SITE_OTHER): Payer: Self-pay | Admitting: Vascular Surgery

## 2017-07-19 ENCOUNTER — Ambulatory Visit
Admission: RE | Admit: 2017-07-19 | Discharge: 2017-07-19 | Disposition: A | Discharge status: Home / Self Care | Payer: BC Managed Care – PPO | Source: Ambulatory Visit | Attending: Vascular Surgery | Admitting: Vascular Surgery

## 2017-07-19 ENCOUNTER — Encounter
Admission: RE | Disposition: A | Discharge status: Home / Self Care | Payer: Self-pay | Source: Ambulatory Visit | Attending: Vascular Surgery

## 2017-07-19 DIAGNOSIS — Z8571 Personal history of Hodgkin lymphoma: Secondary | ICD-10-CM | POA: Diagnosis not present

## 2017-07-19 DIAGNOSIS — Z452 Encounter for adjustment and management of vascular access device: Secondary | ICD-10-CM | POA: Insufficient documentation

## 2017-07-19 DIAGNOSIS — C8190 Hodgkin lymphoma, unspecified, unspecified site: Secondary | ICD-10-CM

## 2017-07-19 DIAGNOSIS — Z9109 Other allergy status, other than to drugs and biological substances: Secondary | ICD-10-CM | POA: Insufficient documentation

## 2017-07-19 DIAGNOSIS — Z9889 Other specified postprocedural states: Secondary | ICD-10-CM | POA: Diagnosis not present

## 2017-07-19 HISTORY — PX: PORTA CATH REMOVAL: CATH118286

## 2017-07-19 LAB — PREGNANCY, URINE: PREG TEST UR: NEGATIVE

## 2017-07-19 SURGERY — PORTA CATH REMOVAL
Anesthesia: Moderate Sedation

## 2017-07-19 MED ORDER — LIDOCAINE-EPINEPHRINE (PF) 1 %-1:200000 IJ SOLN
INTRAMUSCULAR | Status: AC
  Filled 2017-07-19: qty 30

## 2017-07-19 MED ORDER — FENTANYL CITRATE (PF) 100 MCG/2ML IJ SOLN
INTRAMUSCULAR | Status: DC | PRN
Start: 2017-07-19 — End: 2017-07-19
  Administered 2017-07-19: 50 ug via INTRAVENOUS

## 2017-07-19 MED ORDER — CEFAZOLIN SODIUM-DEXTROSE 2-4 GM/100ML-% IV SOLN
2.0000 g | Freq: Once | INTRAVENOUS | Status: AC
  Administered 2017-07-19: 2 g via INTRAVENOUS

## 2017-07-19 MED ORDER — SODIUM CHLORIDE 0.9 % IV SOLN
INTRAVENOUS | Status: DC
  Administered 2017-07-19: 08:00:00 via INTRAVENOUS

## 2017-07-19 MED ORDER — ONDANSETRON HCL 4 MG/2ML IJ SOLN: 4.0000 mg | Freq: Four times a day (QID) | INTRAMUSCULAR | Status: DC | PRN

## 2017-07-19 MED ORDER — HYDROMORPHONE HCL 1 MG/ML IJ SOLN: 1.0000 mg | Freq: Once | INTRAMUSCULAR | Status: DC | PRN

## 2017-07-19 MED ORDER — MIDAZOLAM HCL 5 MG/5ML IJ SOLN
INTRAMUSCULAR | Status: AC
  Filled 2017-07-19: qty 5

## 2017-07-19 MED ORDER — FENTANYL CITRATE (PF) 100 MCG/2ML IJ SOLN
INTRAMUSCULAR | Status: AC
  Filled 2017-07-19: qty 2

## 2017-07-19 MED ORDER — MIDAZOLAM HCL 2 MG/2ML IJ SOLN
INTRAMUSCULAR | Status: DC | PRN
  Administered 2017-07-19: 2 mg via INTRAVENOUS

## 2017-07-19 SURGICAL SUPPLY — 6 items
DERMABOND ADVANCED (GAUZE/BANDAGES/DRESSINGS) ×1
DERMABOND ADVANCED .7 DNX12 (GAUZE/BANDAGES/DRESSINGS) ×1 IMPLANT
PACK ANGIOGRAPHY (CUSTOM PROCEDURE TRAY) ×2 IMPLANT
SUT MNCRL AB 4-0 PS2 18 (SUTURE) ×2 IMPLANT
SUTURE VIC 3-0 (SUTURE) ×2 IMPLANT
TOWEL OR 17X26 4PK STRL BLUE (TOWEL DISPOSABLE) ×2 IMPLANT

## 2017-07-19 NOTE — Op Note (Signed)
[facility]       Operative Note  Date: 07/19/2017  Preoperative diagnosis:  1.  Hodgkin's lymphoma, completed therapy and no longer using port  Postoperative diagnosis:  Same as above  Procedures: #1. Removal of right jugular port a cath   Surgeon: Leotis Pain, MD  Anesthesia: Local with moderate conscious sedation for 15 minutes using 2 mg of Versed and 50 Mcg of Fentanyl  Fluoroscopy time: none  Contrast used: 0  Estimated blood loss: Minimal  Indication for the procedure:  The patient is a 26 y.o. female who has a history of Hodgkin's lymphoma but has completed her therapy and no longer needs their Port-A-Cath. The patient desires to have this removed. Risks and benefits including need for potential replacement with recurrent disease were discussed and patient is agreeable to proceed.  Description of procedure: The patient was brought to the vascular and interventional radiology suite. Moderate conscious sedation was administered during a face to face encounter with the patient throughout the procedure with my supervision of the RN administering medicines and monitoring the patient's vital signs, pulse oximetry, telemetry and mental status throughout from the start of the procedure until the patient was taken to the recovery room.  The right neck chest and shoulder were sterilely prepped and draped, and a sterile surgical field was created. The area was then anesthetized with 1% lidocaine copiously. The previous incision was reopened and electrocautery used to dissected down to the port and the catheter. These were dissected free and the catheter was gently removed from the vein in its entirety. The port was dissected out from the fibrous connective tissue and the Prolene sutures were removed. The port was then removed in its entirety including the catheter. The wound was then closed with a 3-0 Vicryl and a 4-0 Monocryl and  Dermabond was placed as a dressing. The patient was then taken to the recovery room in stable condition having tolerated the procedure well.  Complications: none  Condition: stable   Leotis Pain, MD 07/19/2017 9:26 AM   This note was created with Dragon Medical transcription system. Any errors in dictation are purely unintentional.

## 2017-07-19 NOTE — Discharge Instructions (Signed)
Implanted Port Removal, Care After °Refer to this sheet in the next few weeks. These instructions provide you with information about caring for yourself after your procedure. Your health care provider may also give you more specific instructions. Your treatment has been planned according to current medical practices, but problems sometimes occur. Call your health care provider if you have any problems or questions after your procedure. °What can I expect after the procedure? °After the procedure, it is common to have: °· Soreness or pain near your incision. °· Some swelling or bruising near your incision. ° °Follow these instructions at home: °Medicines °· Take over-the-counter and prescription medicines only as told by your health care provider. °· If you were prescribed an antibiotic medicine, take it as told by your health care provider. Do not stop taking the antibiotic even if you start to feel better. °Bathing °· Do not take baths, swim, or use a hot tub until your health care provider approves. Ask your health care provider if you can take showers. You may only be allowed to take sponge baths for bathing. °Incision care °· Follow instructions from your health care provider about how to take care of your incision. Make sure you: °? Wash your hands with soap and water before you change your bandage (dressing). If soap and water are not available, use hand sanitizer. °? Change your dressing as told by your health care provider. °? Keep your dressing dry. °? Leave stitches (sutures), skin glue, or adhesive strips in place. These skin closures may need to stay in place for 2 weeks or longer. If adhesive strip edges start to loosen and curl up, you may trim the loose edges. Do not remove adhesive strips completely unless your health care provider tells you to do that. °· Check your incision area every day for signs of infection. Check for: °? More redness, swelling, or pain. °? More fluid or  blood. °? Warmth. °? Pus or a bad smell. °Driving °· If you received a sedative, do not drive for 24 hours after the procedure. °· If you did not receive a sedative, ask your health care provider when it is safe to drive. °Activity °· Return to your normal activities as told by your health care provider. Ask your health care provider what activities are safe for you. °· Until your health care provider says it is safe: °? Do not lift anything that is heavier than 10 lb (4.5 kg). °? Do not do activities that involve lifting your arms over your head. °General instructions °· Do not use any tobacco products, such as cigarettes, chewing tobacco, and e-cigarettes. Tobacco can delay healing. If you need help quitting, ask your health care provider. °· Keep all follow-up visits as told by your health care provider. This is important. °Contact a health care provider if: °· You have more redness, swelling, or pain around your incision. °· You have more fluid or blood coming from your incision. °· Your incision feels warm to the touch. °· You have pus or a bad smell coming from your incision. °· You have a fever. °· You have pain that is not relieved by your pain medicine. °Get help right away if: °· You have chest pain. °· You have difficulty breathing. °This information is not intended to replace advice given to you by your health care provider. Make sure you discuss any questions you have with your health care provider. °Document Released: 06/28/2015 Document Revised: 12/23/2015 Document Reviewed: 04/21/2015 °Elsevier Interactive Patient   Education © 2018 [employer] ° °

## 2017-07-19 NOTE — H&P (Signed)
[facility] History & Physical Update  The patient was interviewed and re-examined.  The patient's previous History and Physical has been reviewed and is unchanged.  There is no change in the plan of care. We plan to proceed with the scheduled procedure.  Leotis Pain, MD  07/19/2017, 8:54 AM

## 2017-07-25 ENCOUNTER — Telehealth (INDEPENDENT_AMBULATORY_CARE_PROVIDER_SITE_OTHER): Payer: Self-pay

## 2017-07-25 NOTE — Telephone Encounter (Signed)
Patient called stating she had her port removal on Thursday and ever since Saturday she has been itching.The patient has taken benadryl and cortisone to try relieve the itching.I spoke with KS and advise for the pt to continue taken benadryl but if the patient develop fever,shortness of breath,and throat swelling for her to go to the ED.

## 2017-08-15 ENCOUNTER — Encounter: Payer: BC Managed Care – PPO | Admitting: Advanced Practice Midwife

## 2017-08-16 ENCOUNTER — Encounter: Payer: Self-pay | Admitting: Advanced Practice Midwife

## 2017-08-16 ENCOUNTER — Ambulatory Visit (INDEPENDENT_AMBULATORY_CARE_PROVIDER_SITE_OTHER): Payer: BC Managed Care – PPO | Admitting: Advanced Practice Midwife

## 2017-08-16 VITALS — BP 122/74 | Ht 66.0 in | Wt 173.0 lb

## 2017-08-16 DIAGNOSIS — Z113 Encounter for screening for infections with a predominantly sexual mode of transmission: Secondary | ICD-10-CM

## 2017-08-16 DIAGNOSIS — Z8571 Personal history of Hodgkin lymphoma: Secondary | ICD-10-CM

## 2017-08-16 DIAGNOSIS — O0990 Supervision of high risk pregnancy, unspecified, unspecified trimester: Secondary | ICD-10-CM

## 2017-08-16 DIAGNOSIS — Z124 Encounter for screening for malignant neoplasm of cervix: Secondary | ICD-10-CM

## 2017-08-16 NOTE — Patient Instructions (Signed)
Prenatal Care WHAT IS PRENATAL CARE? Prenatal care is the process of caring for a pregnant woman before she gives birth. Prenatal care makes sure that she and her baby remain as healthy as possible throughout pregnancy. Prenatal care may be provided by a midwife, family practice health care provider, or a childbirth and pregnancy specialist (obstetrician). Prenatal care may include physical examinations, testing, treatments, and education on nutrition, lifestyle, and social support services. WHY IS PRENATAL CARE SO IMPORTANT? Early and consistent prenatal care increases the chance that you and your baby will remain healthy throughout your pregnancy. This type of care also decreases a baby's risk of being born too early (prematurely), or being born smaller than expected (small for gestational age). Any underlying medical conditions you may have that could pose a risk during your pregnancy are discussed during prenatal care visits. You will also be monitored regularly for any new conditions that may arise during your pregnancy so they can be treated quickly and effectively. WHAT HAPPENS DURING PRENATAL CARE VISITS? Prenatal care visits may include the following: Discussion Tell your health care provider about any new signs or symptoms you have experienced since your last visit. These might include:  Nausea or vomiting.  Increased or decreased level of energy.  Difficulty sleeping.  Back or leg pain.  Weight changes.  Frequent urination.  Shortness of breath with physical activity.  Changes in your skin, such as the development of a rash or itchiness.  Vaginal discharge or bleeding.  Feelings of excitement or nervousness.  Changes in your baby's movements.  You may want to write down any questions or topics you want to discuss with your health care provider and bring them with you to your appointment. Examination During your first prenatal care visit, you will likely have a complete  physical exam. Your health care provider will often examine your vagina, cervix, and the position of your uterus, as well as check your heart, lungs, and other body systems. As your pregnancy progresses, your health care provider will measure the size of your uterus and your baby's position inside your uterus. He or she may also examine you for early signs of labor. Your prenatal visits may also include checking your blood pressure and, after about 10-12 weeks of pregnancy, listening to your baby's heartbeat. Testing Regular testing often includes:  Urinalysis. This checks your urine for glucose, protein, or signs of infection.  Blood count. This checks the levels of white and red blood cells in your body.  Tests for sexually transmitted infections (STIs). Testing for STIs at the beginning of pregnancy is routinely done and is required in many states.  Antibody testing. You will be checked to see if you are immune to certain illnesses, such as rubella, that can affect a developing fetus.  Glucose screen. Around 24-28 weeks of pregnancy, your blood glucose level will be checked for signs of gestational diabetes. Follow-up tests may be recommended.  Group B strep. This is a bacteria that is commonly found inside a woman's vagina. This test will inform your health care provider if you need an antibiotic to reduce the amount of this bacteria in your body prior to labor and childbirth.  Ultrasound. Many pregnant women undergo an ultrasound screening around 18-20 weeks of pregnancy to evaluate the health of the fetus and check for any developmental abnormalities.  HIV (human immunodeficiency virus) testing. Early in your pregnancy, you will be screened for HIV. If you are at high risk for HIV, this test may   be repeated during your third trimester of pregnancy.  You may be offered other testing based on your age, personal or family medical history, or other factors. HOW OFTEN SHOULD I PLAN TO SEE MY  HEALTH CARE PROVIDER FOR PRENATAL CARE? Your prenatal care check-up schedule depends on any medical conditions you have before, or develop during, your pregnancy. If you do not have any underlying medical conditions, you will likely be seen for checkups:  Monthly, during the first 6 months of pregnancy.  Twice a month during months 7 and 8 of pregnancy.  Weekly starting in the 9th month of pregnancy and until delivery.  If you develop signs of early labor or other concerning signs or symptoms, you may need to see your health care provider more often. Ask your health care provider what prenatal care schedule is best for you. WHAT CAN I DO TO KEEP MYSELF AND MY BABY AS HEALTHY AS POSSIBLE DURING MY PREGNANCY?  Take a prenatal vitamin containing 400 micrograms (0.4 mg) of folic acid every day. Your health care provider may also ask you to take additional vitamins such as iodine, vitamin D, iron, copper, and zinc.  Take 1500-2000 mg of calcium daily starting at your 20th week of pregnancy until you deliver your baby.  Make sure you are up to date on your vaccinations. Unless directed otherwise by your health care provider: ? You should receive a tetanus, diphtheria, and pertussis (Tdap) vaccination between the 27th and 36th week of your pregnancy, regardless of when your last Tdap immunization occurred. This helps protect your baby from whooping cough (pertussis) after he or she is born. ? You should receive an annual inactivated influenza vaccine (IIV) to help protect you and your baby from influenza. This can be done at any point during your pregnancy.  Eat a well-rounded diet that includes: ? Fresh fruits and vegetables. ? Lean proteins. ? Calcium-rich foods such as milk, yogurt, hard cheeses, and dark, leafy greens. ? Whole grain breads.  Do noteat seafood high in mercury, including: ? Swordfish. ? Tilefish. ? Shark. ? King mackerel. ? More than 6 oz tuna per week.  Do not  eat: ? Raw or undercooked meats or eggs. ? Unpasteurized foods, such as soft cheeses (brie, blue, or feta), juices, and milks. ? Lunch meats. ? Hot dogs that have not been heated until they are steaming.  Drink enough water to keep your urine clear or pale yellow. For many women, this may be 10 or more 8 oz glasses of water each day. Keeping yourself hydrated helps deliver nutrients to your baby and may prevent the start of pre-term uterine contractions.  Do not use any tobacco products including cigarettes, chewing tobacco, or electronic cigarettes. If you need help quitting, ask your health care provider.  Do not drink beverages containing alcohol. No safe level of alcohol consumption during pregnancy has been determined.  Do not use any illegal drugs. These can harm your developing baby or cause a miscarriage.  Ask your health care provider or pharmacist before taking any prescription or over-the-counter medicines, herbs, or supplements.  Limit your caffeine intake to no more than 200 mg per day.  Exercise. Unless told otherwise by your health care provider, try to get 30 minutes of moderate exercise most days of the week. Do not  do high-impact activities, contact sports, or activities with a high risk of falling, such as horseback riding or downhill skiing.  Get plenty of rest.  Avoid anything that raises your   body temperature, such as hot tubs and saunas.  If you own a cat, do not empty its litter box. Bacteria contained in cat feces can cause an infection called toxoplasmosis. This can result in serious harm to the fetus.  Stay away from chemicals such as insecticides, lead, mercury, and cleaning or paint products that contain solvents.  Do not have any X-rays taken unless medically necessary.  Take a childbirth and breastfeeding preparation class. Ask your health care provider if you need a referral or recommendation.  This information is not intended to replace advice given  to you by your health care provider. Make sure you discuss any questions you have with your health care provider. Document Released: 07/20/2003 Document Revised: 12/20/2015 Document Reviewed: 10/01/2013 [employer] Interactive Patient Education  2017 [employer] Exercise During Pregnancy For people of all ages, exercise is an important part of being healthy. Exercise improves heart and lung function and helps to maintain strength, flexibility, and a healthy body weight. Exercise also boosts energy levels and elevates mood. For most women, maintaining an exercise routine throughout pregnancy is recommended. It is only on rare occasions and with certain medical conditions or pregnancy complications that women may be asked to limit or avoid exercise during pregnancy. What are some other benefits to exercising during pregnancy? Along with maintaining strength and flexibility, exercising throughout pregnancy can help to:  Keep strength in muscles that are very important during labor and childbirth.  Decrease low back pain during pregnancy.  Decrease the risk of developing gestational diabetes mellitus (GDM).  Improve blood sugar (glucose) control for women who have GDM.  Decrease the risk of developing preeclampsia. This is a serious condition that causes high blood pressure along with other symptoms, such as swelling and headaches.  Decrease the risk of cesarean delivery.  Speed up the recovery after giving birth.  How often should I exercise? Unless your health care provider gives you different instructions, you should try to exercise on most days or all days of the week. In general, try to exercise with moderate intensity for about 150 minutes per week. This can be spread out across several days, such as exercising for 30 minutes per day on 5 days of each week. You can tell that you are exercising at a moderate intensity if you have a higher heart rate and faster breathing, but you are still  able to hold a conversation. What types of moderate-intensity exercise are recommended during pregnancy? There are many types of exercise that are safe for you to do during pregnancy. Unless your health care provider gives you different instructions, do a variety of exercises that safely increase your heart and breathing (cardiopulmonary) rates and help you to build and maintain muscle strength (strength training). You should always be able to talk in full sentences while exercising during pregnancy. Some examples of exercising that is safe to do during pregnancy include:  Brisk walking or hiking.  Swimming.  Water aerobics.  Riding a stationary bike.  Strength training.  Modified yoga or Pilates. Tell your instructor that you are pregnant. Avoid overstretching and avoid lying on your back for long periods of time.  Running or jogging. Only choose this type of exercise if: ? You ran or jogged regularly before your pregnancy. ? You can run or jog and still talk in complete sentences.  What types of exercise should I not do during pregnancy? Depending on your level of fitness and whether you exercised regularly before your pregnancy, you may be   advised to limit vigorous-intensity exercise during your pregnancy. You can tell that you are exercising at a vigorous intensity if you are breathing much harder and faster and cannot hold a conversation while exercising. Some examples of exercising that you should avoid during pregnancy include:  Contact sports.  Activities that place you at risk for falling on or being hit in the belly, such as downhill skiing, water skiing, surfing, rock climbing, cycling, gymnastics, and horseback riding.  Scuba diving.  Sky diving.  Yoga or Pilates in a room that is heated to extreme temperatures ("hot yoga" or "hot Pilates").  Jogging or running, unless you ran or jogged regularly before your pregnancy. While jogging or running, you should always be able  to talk in full sentences. Do not run or jog so vigorously that you are unable to have a conversation.  If you are not used to exercising at elevation (more than 6,000 feet above sea level), do not do so during your pregnancy.  When should I avoid exercising during pregnancy? Certain medical conditions can make it unsafe to exercise during pregnancy, or they may increase your risk of miscarriage or early labor and birth. Some of these conditions include:  Some types of heart disease.  Some types of lung disease.  Placenta previa. This is when the placenta partially or completely covers the opening of the uterus (cervix).  Frequent bleeding from the vagina during your pregnancy.  Incompetent cervix. This is when your cervix does not remain as tightly closed during pregnancy as it should.  Premature labor.  Ruptured membranes. This is when the protective sac (amniotic sac) opens up and amniotic fluid leaks from your vagina.  Severely low blood count (anemia).  Preeclampsia or pregnancy-caused high blood pressure.  Carrying more than one baby (multiple gestation) and having an additional risk of early labor.  Poorly controlled diabetes.  Being severely underweight or severely overweight.  Intrauterine growth restriction. This is when your baby's growth and development during pregnancy are slower than expected.  Other medical conditions. Ask your health care provider if any apply to you.  What else should I know about exercising during pregnancy? You should take these precautions while exercising during pregnancy:  Avoid overheating. ? Wear loose-fitting, breathable clothes. ? Do not exercise in very high temperatures.  Avoid dehydration. Drink enough water before, during, and after exercise to keep your urine clear or pale yellow.  Avoid overstretching. Because of hormone changes during pregnancy, it is easy to overstretch muscles, tendons, and ligaments during  pregnancy.  Start slowly and ask your health care provider to recommend types of exercise that are safe for you, if exercising regularly is new for you.  Pregnancy is not a time for exercising to lose weight. When should I seek medical care? You should stop exercising and call your health care provider if you have any unusual symptoms, such as:  Mild uterine contractions or abdominal cramping.  Dizziness that does not improve with rest.  When should I seek immediate medical care? You should stop exercising and call your local emergency services (911 in the [place] if you have any unusual symptoms, such as:  Sudden, severe pain in your low back or your belly.  Uterine contractions or abdominal cramping that do not improve with rest.  Chest pain.  Bleeding or fluid leaking from your vagina.  Shortness of breath.  This information is not intended to replace advice given to you by your health care provider. Make sure you discuss any   questions you have with your health care provider. Document Released: 07/17/2005 Document Revised: 12/15/2015 Document Reviewed: 09/24/2014 [employer] Interactive Patient Education  2018 [employer] Eating Plan for Pregnant Women While you are pregnant, your body will require additional nutrition to help support your growing baby. It is recommended that you consume:  150 additional calories each day during your first trimester.  300 additional calories each day during your second trimester.  300 additional calories each day during your third trimester.  Eating a healthy, well-balanced diet is very important for your health and for your baby's health. You also have a higher need for some vitamins and minerals, such as folic acid, calcium, iron, and vitamin D. What do I need to know about eating during pregnancy?  Do not try to lose weight or go on a diet during pregnancy.  Choose healthy, nutritious foods. Choose  of a sandwich with a glass of milk  instead of a candy bar or a high-calorie sugar-sweetened beverage.  Limit your overall intake of foods that have "empty calories." These are foods that have little nutritional value, such as sweets, desserts, candies, sugar-sweetened beverages, and fried foods.  Eat a variety of foods, especially fruits and vegetables.  Take a prenatal vitamin to help meet the additional needs during pregnancy, specifically for folic acid, iron, calcium, and vitamin D.  Remember to stay active. Ask your health care provider for exercise recommendations that are specific to you.  Practice good food safety and cleanliness, such as washing your hands before you eat and after you prepare raw meat. This helps to prevent foodborne illnesses, such as listeriosis, that can be very dangerous for your baby. Ask your health care provider for more information about listeriosis. What does 150 extra calories look like? Healthy options for an additional 150 calories each day could be any of the following:  Plain low-fat yogurt (6-8 oz) with  cup of berries.  1 apple with 2 teaspoons of peanut butter.  Cut-up vegetables with  cup of hummus.  Low-fat chocolate milk (8 oz or 1 cup).  1 string cheese with 1 medium orange.   of a peanut butter and jelly sandwich on whole-wheat bread (1 tsp of peanut butter).  For 300 calories, you could eat two of those healthy options each day. What is a healthy amount of weight to gain? The recommended amount of weight for you to gain is based on your pre-pregnancy BMI. If your pre-pregnancy BMI was:  Less than 18 (underweight), you should gain 28-40 lb.  18-24.9 (normal), you should gain 25-35 lb.  25-29.9 (overweight), you should gain 15-25 lb.  Greater than 30 (obese), you should gain 11-20 lb.  What if I am having twins or multiples? Generally, pregnant women who will be having twins or multiples may need to increase their daily calories by 300-600 calories each day. The  recommended range for total weight gain is 25-54 lb, depending on your pre-pregnancy BMI. Talk with your health care provider for specific guidance about additional nutritional needs, weight gain, and exercise during your pregnancy. What foods can I eat? Grains Any grains. Try to choose whole grains, such as whole-wheat bread, oatmeal, or brown rice. Vegetables Any vegetables. Try to eat a variety of colors and types of vegetables to get a full range of vitamins and minerals. Remember to wash your vegetables well before eating. Fruits Any fruits. Try to eat a variety of colors and types of fruit to get a full range of vitamins and   minerals. Remember to wash your fruits well before eating. Meats and Other Protein Sources Lean meats, including chicken, turkey, fish, and lean cuts of beef, veal, or pork. Make sure that all meats are cooked to "well done." Tofu. Tempeh. Beans. Eggs. Peanut butter and other nut butters. Seafood, such as shrimp, crab, and lobster. If you choose fish, select types that are higher in omega-3 fatty acids, including salmon, herring, mussels, trout, sardines, and pollock. Make sure that all meats are cooked to food-safe temperatures. Dairy Pasteurized milk and milk alternatives. Pasteurized yogurt and pasteurized cheese. Cottage cheese. Sour cream. Beverages Water. Juices that contain 100% fruit juice or vegetable juice. Caffeine-free teas and decaffeinated coffee. Drinks that contain caffeine are okay to drink, but it is better to avoid caffeine. Keep your total caffeine intake to less than 200 mg each day (12 oz of coffee, tea, or soda) or as directed by your health care provider. Condiments Any pasteurized condiments. Sweets and Desserts Any sweets and desserts. Fats and Oils Any fats and oils. The items listed above may not be a complete list of recommended foods or beverages. Contact your dietitian for more options. What foods are not  recommended? Vegetables Unpasteurized (raw) vegetable juices. Fruits Unpasteurized (raw) fruit juices. Meats and Other Protein Sources Cured meats that have nitrates, such as bacon, salami, and hotdogs. Luncheon meats, bologna, or other deli meats (unless they are reheated until they are steaming hot). Refrigerated pate, meat spreads from a meat counter, smoked seafood that is found in the refrigerated section of a store. Raw fish, such as sushi or sashimi. High mercury content fish, such as tilefish, shark, swordfish, and king mackerel. Raw meats, such as tuna or beef tartare. Undercooked meats and poultry. Make sure that all meats are cooked to food-safe temperatures. Dairy Unpasteurized (raw) milk and any foods that have raw milk in them. Soft cheeses, such as feta, queso blanco, queso fresco, Brie, Camembert cheeses, blue-veined cheeses, and Panela cheese (unless it is made with pasteurized milk, which must be stated on the label). Beverages Alcohol. Sugar-sweetened beverages, such as sodas, teas, or energy drinks. Condiments Homemade fermented foods and drinks, such as pickles, sauerkraut, or kombucha drinks. (Store-bought pasteurized versions of these are okay.) Other Salads that are made in the store, such as ham salad, chicken salad, egg salad, tuna salad, and seafood salad. The items listed above may not be a complete list of foods and beverages to avoid. Contact your dietitian for more information. This information is not intended to replace advice given to you by your health care provider. Make sure you discuss any questions you have with your health care provider. Document Released: 05/01/2014 Document Revised: 12/23/2015 Document Reviewed: 12/30/2013 [employer] Interactive Patient Education  2018 [employer]  

## 2017-08-16 NOTE — Progress Notes (Signed)
New Obstetric Patient H&P    Chief Complaint: "Desires prenatal care"   History of Present Illness: Patient is a 27 y.o. G1P0 Not Hispanic or Latino female, LMP 07/03/2017 presents with amenorrhea and positive home pregnancy test about 2 weeks ago. Based on her LMP, her EDD is Estimated Date of Delivery: 04/09/2018. and her EGA is [redacted]w[redacted]d. Cycles are 6. days, regular, and occur approximately every : 28 days. Her last pap smear was 1 or 2 years ago and was no abnormalities.    She had a urine pregnancy test which was positive 2 week(s)  ago. Her last menstrual period was normal and lasted for  5 or 6 day(s). Since her LMP she claims she has experienced breast tenderness, fatigue, nausea. She denies vaginal bleeding. Her past medical history is contributory for Hodgkins Lymphoma at age 66 and again at age 56. She was treated with chemotherapy and then again with chemotherapy and bone marrow transplant. She had her eggs frozen at age 51 prior to the first round of treatment for future use.  Her hormones were in the menopausal range following her last treatment and was told she would probably never naturally conceive. She was married in October and she and her husband were considering adoption in the future. She had her access port removed under anesthesia about a month ago prior to her knowledge of the pregnancy.   This is her first pregnancy.  Since her LMP, she admits to the use of tobacco products  no She claims she has gained  8 pounds since the start of her pregnancy.  There are cats in the home in the home  No. She does have a miniature pig in the home that uses the litter box. She is instructed to have her husband clean the litter box She admits close contact with children on a regular basis  no  She has had chicken pox in the past no She has had Tuberculosis exposures, symptoms, or previously tested positive for TB   no Current or past history of domestic violence. no  Genetic  Screening/Teratology Counseling: (Includes patient, baby's father, or anyone in either family with:)   59. Patient's age >/= 30 at [facility]  no 2. Thalassemia ([place], [place], [place], or Asian background): MCV<80  no 3. Neural tube defect (meningomyelocele, spina bifida, anencephaly)  no 4. Congenital heart defect  no  5. Down syndrome  no 6. Tay-Sachs (Jewish, [place])  no 7. Canavan's Disease  no 8. Sickle cell disease or trait (African)  no  9. Hemophilia or other blood disorders  no  10. Muscular dystrophy  no  11. Cystic fibrosis  no  12. Huntington's Chorea  no  13. Mental retardation/autism  no 14. Other inherited genetic or chromosomal disorder  no 15. Maternal metabolic disorder (DM, PKU, etc)  no 16. Patient or FOB with a child with a birth defect not listed above no  16a. Patient or FOB with a birth defect themselves no 17. Recurrent pregnancy loss, or stillbirth  no  18. Any medications since LMP other than prenatal vitamins (include vitamins, supplements, OTC meds, drugs, alcohol)  Yes Focalin and Imitrex. She has already stopped taking them and is instructed to discontinue use especially during 1st trimester 19. Any other genetic/environmental exposure to discuss  no  Infection History:   1. Lives with someone with TB or TB exposed  no  2. Patient or partner has history of genital herpes  no 3. Rash or viral illness  since LMP  no 4. History of STI (GC, CT, HPV, syphilis, HIV)  no 5. History of recent travel :  no  Other pertinent information:  She was revaccinated for childhood illnesses following the bone marrow transplant and is unsure of her immunity to those viruses.    Review of Systems:10 point review of systems negative unless otherwise noted in HPI  Past Medical History:  Past Medical History:  Diagnosis Date  . Encounter for fertility preservation procedure Nov 2012  . Hodgkin's disease, unspecified type, of lymph nodes of multiple sites      Past Surgical History:  Past Surgical History:  Procedure Laterality Date  . LYMPH NODE BIOPSY  2012  . PORTA CATH REMOVAL N/A 07/19/2017   Procedure: PORTA CATH REMOVAL;  Surgeon: Algernon Huxley, MD;  Location: [facility];  Service: Cardiovascular;  Laterality: N/A;  . PORTACATH PLACEMENT  Oct 2012  . wisdom teeth removal   AGE 72    Gynecologic History: Patient's last menstrual period was 07/03/2017.  Obstetric History: G1P0  Family History:  Family History  Problem Relation Age of Onset  . Cancer Paternal Grandfather     Social History:  Social History   Socioeconomic History  . Marital status: Married    Spouse name: Not on file  . Number of children: Not on file  . Years of education: Not on file  . Highest education level: Not on file  Social Needs  . Financial resource strain: Not on file  . Food insecurity - worry: Not on file  . Food insecurity - inability: Not on file  . Transportation needs - medical: Not on file  . Transportation needs - non-medical: Not on file  Occupational History  . Not on file  Tobacco Use  . Smoking status: Never Smoker  . Smokeless tobacco: Never Used  Substance and Sexual Activity  . Alcohol use: Yes  . Drug use: No  . Sexual activity: Yes    Birth control/protection: None  Other Topics Concern  . Not on file  Social History Narrative  . Not on file    Allergies:  Allergies  Allergen Reactions  . Tape Rash    Medications: Prior to Admission medications   Medication Sig Start Date End Date Taking? Authorizing Provider  acetaminophen (TYLENOL) 325 MG tablet Take 650 mg by mouth every 6 (six) hours as needed (for pain.).    [provider]  dexmethylphenidate (FOCALIN) 10 MG tablet Take 10 mg by mouth 2 (two) times daily.  03/22/17   [provider]  SUMAtriptan (IMITREX) 100 MG tablet Take 100 mg by mouth every 2 (two) hours as needed (for pain.).  02/02/14   [provider]     Physical Exam Vitals: Blood pressure 122/74, height 5\' 6"  (1.676 m), weight 173 lb (78.5 kg), last menstrual period 07/03/2017.  General: NAD HEENT: normocephalic, anicteric Thyroid: no enlargement, no palpable nodules Pulmonary: No increased work of breathing, CTAB Cardiovascular: RRR, distal pulses 2+ Abdomen: NABS, soft, non-tender, non-distended.  Umbilicus without lesions.  No hepatomegaly, splenomegaly or masses palpable. No evidence of hernia  Genitourinary:  External: Normal external female genitalia.  Normal urethral meatus, normal  Bartholin's and Skene's glands.    Vagina: Normal vaginal mucosa, no evidence of prolapse.    Cervix: Grossly normal in appearance, no bleeding, no CMT  Uterus: Enlarged, mobile, normal contour.    Adnexa: ovaries non-enlarged, no adnexal masses  Rectal: deferred Extremities: no edema, erythema, or tenderness  Neurologic: Grossly intact Psychiatric: mood appropriate, affect full   Assessment: 27 y.o. G1P0 at [redacted]w[redacted]d by LMP presenting to initiate prenatal care  Plan: 1) Avoid alcoholic beverages. 2) Patient encouraged not to smoke.  3) Discontinue the use of all non-medicinal drugs and chemicals.  4) Take prenatal vitamins daily.  5) Nutrition, food safety (fish, cheese advisories, and high nitrite foods) and exercise discussed. 6) Hospital and practice style discussed with cross coverage system.  7) Genetic Screening, such as with 1st Trimester Screening, cell free fetal DNA, AFP testing, and Ultrasound, as well as with amniocentesis and CVS as appropriate, is discussed with patient. At the conclusion of today's visit patient requested genetic testing 8) Patient is asked about travel to areas at risk for the Zika virus, and counseled to avoid travel and exposure to mosquitoes or sexual partners who may have themselves been exposed to the virus. Testing is discussed, and will be ordered as appropriate.   Rod Can, CNM

## 2017-08-16 NOTE — Progress Notes (Signed)
NOB today. No vb. No lof. Needs RX for prenatal vitamin.

## 2017-08-17 LAB — RPR+RH+ABO+RUB AB+AB SCR+CB...
Antibody Screen: NEGATIVE
HIV Screen 4th Generation wRfx: NONREACTIVE
Hematocrit: 38.5 % (ref 34.0–46.6)
Hemoglobin: 12.9 g/dL (ref 11.1–15.9)
Hepatitis B Surface Ag: NEGATIVE
MCH: 32 pg (ref 26.6–33.0)
MCHC: 33.5 g/dL (ref 31.5–35.7)
MCV: 96 fL (ref 79–97)
PLATELETS: 264 10*3/uL (ref 150–379)
RBC: 4.03 x10E6/uL (ref 3.77–5.28)
RDW: 13.9 % (ref 12.3–15.4)
RPR Ser Ql: NONREACTIVE
RUBELLA: 3.22 {index} (ref >0.99)
Rh Factor: POSITIVE
VARICELLA: 190 {index} (ref >165)
WBC: 5.9 10*3/uL (ref 3.4–10.8)

## 2017-08-17 LAB — URINE CULTURE

## 2017-08-17 LAB — BETA HCG QUANT (REF LAB): HCG QUANT: 140178 m[IU]/mL

## 2017-08-20 ENCOUNTER — Other Ambulatory Visit: Payer: Self-pay | Admitting: *Deleted

## 2017-08-21 ENCOUNTER — Telehealth: Payer: Self-pay

## 2017-08-21 NOTE — Telephone Encounter (Signed)
Spoke with patient and discussed the need for a follow up quant level. Given the results of the first, it is probably fine to go without a follow up level. She has no complaints at this time.

## 2017-08-21 NOTE — Telephone Encounter (Signed)
PT called triage line stating she was told by the midwife she could go on Saturday and get the rest of her blood work. She/nor the after hour triage nurse could find a [facility] that was open on Saturday. She was unable to get the second part of the lab work. She is hoping this will not hinder her getting the rest of her results. Please call back and let her know what she needs to do. [phone]

## 2017-08-22 LAB — IGP,CTNGTV,RFX APTIMA HPV ASCU
Chlamydia, Nuc. Acid Amp: NEGATIVE
Gonococcus, Nuc. Acid Amp: NEGATIVE
PAP SMEAR COMMENT: 0
TRICH VAG BY NAA: NEGATIVE

## 2017-08-22 LAB — HPV APTIMA: HPV Aptima: NEGATIVE

## 2017-08-27 ENCOUNTER — Other Ambulatory Visit: Payer: Self-pay | Admitting: *Deleted

## 2017-08-27 DIAGNOSIS — Z3401 Encounter for supervision of normal first pregnancy, first trimester: Secondary | ICD-10-CM

## 2017-08-30 ENCOUNTER — Other Ambulatory Visit
Admission: RE | Admit: 2017-08-30 | Discharge: 2017-08-30 | Disposition: A | Discharge status: Home / Self Care | Payer: BC Managed Care – PPO | Source: Ambulatory Visit | Attending: Maternal & Fetal Medicine | Admitting: Maternal & Fetal Medicine

## 2017-08-30 ENCOUNTER — Encounter: Payer: BC Managed Care – PPO | Admitting: Certified Nurse Midwife

## 2017-08-30 ENCOUNTER — Other Ambulatory Visit: Payer: Self-pay | Admitting: *Deleted

## 2017-08-30 ENCOUNTER — Ambulatory Visit (HOSPITAL_BASED_OUTPATIENT_CLINIC_OR_DEPARTMENT_OTHER)
Admission: RE | Admit: 2017-08-30 | Discharge: 2017-08-30 | Disposition: A | Discharge status: Home / Self Care | Payer: BC Managed Care – PPO | Source: Ambulatory Visit | Attending: Oncology | Admitting: Oncology

## 2017-08-30 ENCOUNTER — Other Ambulatory Visit: Payer: BC Managed Care – PPO

## 2017-08-30 ENCOUNTER — Ambulatory Visit (HOSPITAL_BASED_OUTPATIENT_CLINIC_OR_DEPARTMENT_OTHER)
Admission: RE | Admit: 2017-08-30 | Discharge: 2017-08-30 | Disposition: A | Discharge status: Home / Self Care | Payer: BC Managed Care – PPO | Source: Ambulatory Visit | Attending: Maternal & Fetal Medicine | Admitting: Maternal & Fetal Medicine

## 2017-08-30 ENCOUNTER — Ambulatory Visit
Admission: RE | Admit: 2017-08-30 | Discharge: 2017-08-30 | Disposition: A | Discharge status: Home / Self Care | Payer: BC Managed Care – PPO | Source: Ambulatory Visit | Attending: Maternal & Fetal Medicine | Admitting: Maternal & Fetal Medicine

## 2017-08-30 VITALS — BP 129/71 | HR 74 | Temp 98.2°F | Resp 18 | Wt 169.0 lb

## 2017-08-30 DIAGNOSIS — Z8571 Personal history of Hodgkin lymphoma: Secondary | ICD-10-CM

## 2017-08-30 DIAGNOSIS — R41840 Attention and concentration deficit: Secondary | ICD-10-CM | POA: Diagnosis not present

## 2017-08-30 DIAGNOSIS — Z9481 Bone marrow transplant status: Secondary | ICD-10-CM

## 2017-08-30 DIAGNOSIS — C8118 Nodular sclerosis classical Hodgkin lymphoma, lymph nodes of multiple sites: Secondary | ICD-10-CM

## 2017-08-30 DIAGNOSIS — Z3A09 9 weeks gestation of pregnancy: Secondary | ICD-10-CM | POA: Insufficient documentation

## 2017-08-30 DIAGNOSIS — Z3401 Encounter for supervision of normal first pregnancy, first trimester: Secondary | ICD-10-CM

## 2017-08-30 DIAGNOSIS — O0991 Supervision of high risk pregnancy, unspecified, first trimester: Secondary | ICD-10-CM

## 2017-08-30 DIAGNOSIS — G43909 Migraine, unspecified, not intractable, without status migrainosus: Secondary | ICD-10-CM | POA: Diagnosis not present

## 2017-08-30 DIAGNOSIS — E288 Other ovarian dysfunction: Secondary | ICD-10-CM

## 2017-08-30 DIAGNOSIS — R87610 Atypical squamous cells of undetermined significance on cytologic smear of cervix (ASC-US): Secondary | ICD-10-CM | POA: Diagnosis not present

## 2017-08-30 DIAGNOSIS — E2839 Other primary ovarian failure: Secondary | ICD-10-CM

## 2017-08-30 NOTE — Progress Notes (Signed)
[facility] Consultation   Referred by:  Dr. Delight Hoh, hematologist, [facility]  Chief Complaint: Newly pregnant  HPI: Alisha Vincent is a 27 y.o. G1P0 at [redacted]w[redacted]d by LMP, but [redacted]w[redacted]d by [place] today who presents in consultation from  Dr. 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 Her prior chemo included Adriamycin and Bleomycin.  Prior to autologous BMT, she underwent oocyte preservation, but she conceived spontaneously despite symptoms and laboratory evidence of menopause.    She has been well and free of disease since her BMT.  She registered for PN care at [facility], but due to her complex history, she plans to transfer to [facility] for further prenatal care and delivery.  She is here today for initial consultation. She is accompanied by her husband.     Gynecologic History:  Patient's last menstrual period was 07/03/2017. Her menses stopped during the BMT, but restarted with some regularity afterward.  History of  oocyte preservation  06/2011  Last Pap smear was 08/16/2017 with ASCUS.  HPV negative.  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 Attention deficit Migraine headaches   Past Surgical History: She  has a past surgical history that includes Lymph node biopsy (2012); wisdom teeth removal  (AGE 50); Portacath placement (Oct 2012); and PORTA CATH REMOVAL (N/A, 07/19/2017).   Medications:  Current Outpatient Medications on File Prior to Encounter  Medication Sig Dispense Refill  . acetaminophen (TYLENOL) 325 MG tablet Take 650 mg by mouth every 6 (six) hours as needed (for pain.).    Marland Kitchen dexmethylphenidate (FOCALIN) 10 MG tablet Only taking 5 mg and not every day    PN vitamins   Allergies: Patient is allergic to tape.   Social History: Patient  reports that  has never smoked. she has never used smokeless tobacco. She  reports that she drinks alcohol. She reports that she does not use drugs.  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  She was recently married.  The pregnancy is a surprise.  Her husband is a Dealer for the [employer].  Family History: family history includes Cancer in her paternal grandfather - unknown origin.  MGF with heart disease/pacemaker   Review of Systems A full 12 point review of systems was negative or as noted in the History of Present Illness.  Physical Exam: BP 129/71 (BP Location: Right Arm)   Pulse 74   Temp 98.2 F (36.8 C) (Oral)   Resp 18   Wt 169 lb (76.7 kg)   LMP 07/03/2017   SpO2 100%   BMI 27.28 kg/m   [place] today: [redacted]w[redacted]d by CRL.  2 cm simple cyst L ovary.  Labs: 06/16/2018 - PN labs: BT O positive, antibody negative HIV NR; RPR NR; Hep B neg; RI, VI Lab Results  Component Value Date   WBC 5.9 08/16/2017   HGB 12.9 08/16/2017   HCT 38.5 08/16/2017   MCV 96 08/16/2017   PLT 264 08/16/2017  Urine C & S - mixed flora  03/14/2017 - CMP normal; creat 0.88  07/02/2014 - echo normal; spirometry normal   Asessement: 27 yo gravida 1 at [redacted]w[redacted]d gestation by [place] today with: 1. 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 Her prior chemo included Adriamycin and Bleomycin. 2. Ovarian sufficiency 3. History of migraines  4. History of attention deficit 5. ASCUS Pap - negative HPV on 08/16/2017 6. Routine OB    Recommendations: 1. 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 Her prior chemo included Adriamycin and Bleomycin. -- Baseline echo and PFTs.  Consider repeating in third trimester. -- Minimal fetal surveillance to include USs every 6 weeks in the third trimester, weekly testing and delivery at [redacted] weeks gestation. 2. Ovarian sufficiency -- Discussed with Alisha Kehr, MD, REI.  Progesterone level today - if < 20,  supplement with Prometrium 200 mg per vagina bid until 12 weeks. 3. History of migraines -- OK to take Tylenol 500 mg x 2 every 6 hrs not to exceed 3000 mg per day.  Other meds per migraine protocol prn. 4. History of attention deficit -- We discussed the unlikely but unknown risks of methylphenidate exposure in pregnancy.  We did not discourage her from taking this low dose as necessary. 5. ASCUS Pap - negative HPV on 08/16/2017 -- Repeat Pap PP.  Follow-up with GYN. 6. Routine OB -- Genetic counseling today.  Will consider PN diagnostic options, but will probably decline. -- RTC 4 weeks [place].  Scheduled for first trimester [place] then.   Total time spent with the patient was 60 minutes with greater than 50% spent in counseling and coordination of care.  We appreciate this interesting consult and will plan to be involved in the ongoing care of [name] .  Erasmo Score, MD [facility]

## 2017-08-30 NOTE — Progress Notes (Addendum)
Referring physician:  [facility] Length of Consultation: 50 minutes   [name]  was referred to [facility] for and initial MFM consultation and genetic counseling to review prenatal screening and testing options.  She has a complex medical history which includes history of Hodgkins lymphoma with recurrence and autologous bone marrow transplant in 2016.  This note summarizes the information we discussed.    We first obtained a detailed family history and pregnancy history. See Dr. Jeneen Rinks note for additional details and recommendations for pregnancy management.  This is the first pregnancy for Leala and her husband Lovena Le.  She reports no complications or exposures in this pregnancy to alcohol, tobacco or recreational drugs.  She was taking Imitrex for migraines but stopped in late December, during the all or none period.  She continues to take Focalin as needed for work.  She reports taking only half of her dose and not every day.  For the migraines, she is currently taking Tylenol but Dr. Jeneen Rinks encouraged her to let the clinic know if she were to need something more during the pregnancy.  Focalin has not been shown to increase the risk for birth defects, though data is somewhat limited in human pregnancy.  We reviewed the importance of balancing risks and benefits, which Paisleigh has been doing. The family history is unremarkable for birth defects, genetic syndromes, chromosome conditions, recurrent pregnancy loss, intellectual disabilities or consanguinity.  The father of the baby was said to have "no soft spot" when he was a baby but he never required medical intervention, does not have an abnormal head shape and has no other physical features suggestive of a syndrome involving craniosynostosis.   We discussed Sharrie's history of Hodgkins lymphoma which was initially diagnosed at age 55 and treated, then a recurrence was diagnosed and treated with autologous bone marrow  transplant in 2016.  Prior to her first treatment, she elected to have eggs frozen to allow for future fertility options.  Following her BMT, she reported labs and symptoms that indicated she was in menopause.  Her periods were irregular for some time, then normalized a bit in the last year.  Given her medical history, the couple was not trying to prevent pregnancy, and this spontaneous pregnancy was a surprise.  A review of data on pregnancy outcomes of women following cancer treatment is very encouraging.  One study by Sherron Monday al included sibling matched pairs and showed no increased incidence of birth defects, genetic syndromes or chromosome conditions in more than 6,000 offspring of cancer survivors when compared to mopre than 3,000 children born to their siblings without a history of cancer.  Another study in [place] yielded similar results. Based upon this data, there is no reason to expect that this pregnancy is at increased risk for single gene disorders, chromosome conditions or birth defects.  For this reason, we offered the following routine screening options for this pregnancy:  First trimester screening, which includes nuchal translucency ultrasound screen and first trimester maternal serum marker screening.  The nuchal translucency has approximately an 80% detection rate for Down syndrome and can be positive for other chromosome abnormalities as well as congenital heart defects.  When combined with a maternal serum marker screening, the detection rate is up to 90% for Down syndrome and up to 97% for trisomy 18.     Maternal serum marker screening, a blood test that measures pregnancy proteins, can provide risk assessments for Down syndrome, trisomy 18, and open neural  tube defects (spina bifida, anencephaly). Because it does not directly examine the fetus, it cannot positively diagnose or rule out these problems.  Targeted ultrasound uses high frequency sound waves to create an image of the  developing fetus.  An ultrasound is often recommended as a routine means of evaluating the pregnancy.  It is also used to screen for fetal anatomy problems (for example, a heart defect) that might be suggestive of a chromosomal or other abnormality.   Should these screening tests indicate an increased concern, then the following additional testing options would be offered:  The chorionic villus sampling procedure is available for first trimester chromosome analysis.  This involves the withdrawal of a small amount of chorionic villi (tissue from the developing placenta).  Risk of pregnancy loss is estimated to be approximately 1 in 200 to 1 in 100 (0.5 to 1%).  There is approximately a 1% (1 in 100) chance that the CVS chromosome results will be unclear.  Chorionic villi cannot be tested for neural tube defects.     Amniocentesis involves the removal of a small amount of amniotic fluid from the sac surrounding the fetus with the use of a thin needle inserted through the maternal abdomen and uterus.  Ultrasound guidance is used throughout the procedure.  Fetal cells from amniotic fluid are directly evaluated and > 99.5% of chromosome problems and > 98% of open neural tube defects can be detected. This procedure is generally performed after the 15th week of pregnancy.  The main risks to this procedure include complications leading to miscarriage in less than 1 in 200 cases (0.5%).  As another option for information if the pregnancy is suspected to be an an increased chance for certain chromosome conditions, we also reviewed the availability of cell free fetal DNA testing from maternal blood to determine whether or not the baby may have either Down syndrome, trisomy 44, or trisomy 64.  This test utilizes a maternal blood sample and DNA sequencing technology to isolate circulating cell free fetal DNA from maternal plasma.  The fetal DNA can then be analyzed for DNA sequences that are derived from the three most  common chromosomes involved in aneuploidy, chromosomes 13, 18, and 21.  If the overall amount of DNA is greater than the expected level for any of these chromosomes, aneuploidy is suspected.  While we do not consider it a replacement for invasive testing and karyotype analysis, a negative result from this testing would be reassuring, though not a guarantee of a normal chromosome complement for the baby.  An abnormal result is certainly suggestive of an abnormal chromosome complement, though we would still recommend CVS or amniocentesis to confirm any findings from this testing.  Cystic Fibrosis and Spinal Muscular Atrophy (SMA) screening were also discussed with the patient. Both conditions are recessive, which means that both parents must be carriers in order to have a child with the disease.  Cystic fibrosis (CF) is one of the most common genetic conditions in persons of Caucasian ancestry.  This condition occurs in approximately 1 in 2,500 Caucasian persons and results in thickened secretions in the lungs, digestive, and reproductive systems.  For a baby to be at risk for having CF, both of the parents must be carriers for this condition.  Approximately 1 in 64 Caucasian persons is a carrier for CF.  Current carrier testing looks for the most common mutations in the gene for CF and can detect approximately 90% of carriers in the Caucasian population.  This  means that the carrier screening can greatly reduce, but cannot eliminate, the chance for an individual to have a child with CF.  If an individual is found to be a carrier for CF, then carrier testing would be available for the partner. As part of [place] newborn screening profile, all babies born in the state of [place] will have a two-tier screening process.  Specimens are first tested to determine the concentration of immunoreactive trypsinogen (IRT).  The top 5% of specimens with the highest IRT values then undergo DNA testing using a  panel of over 40 common CF mutations. SMA is a neurodegenerative disorder that leads to atrophy of skeletal muscle and overall weakness.  This condition is also more prevalent in the Caucasian population, with 1 in 40-1 in 60 persons being a carrier and 1 in 6,000-1 in 10,000 children being affected.  There are multiple forms of the disease, with some causing death in infancy to other forms with survival into adulthood.  The genetics of SMA is complex, but carrier screening can detect up to 95% of carriers in the Caucasian population.  Similar to CF, a negative result can greatly reduce, but cannot eliminate, the chance to have a child with SMA.  After counseling, Ms. Dawna Part elected to schedule her new OB visit in [place] along with an ultrasound on 09/28/2017.  She will be 13 weeks, 2 days.  They wanted to review the testing options and determine what, if any, testing they desire prior to that visit.  I provided CPT codes for cell free fetal DNA testing, CF and SMA screening so she can confirm insurance coverage.  They are confident that the results of any genetic testing would not alter their management of the pregnancy, but legal limits were reviewed.  An ultrasound was performed at the time of the visit.  The gestational age was consistent with  9 weeks, 1 day.  Fetal anatomy could not be assessed due to early gestational age.  Please refer to the ultrasound report for details of that study.  Ms. Dawna Part was encouraged to call with questions or concerns.  We can be contacted at [phone].    Wilburt Finlay, MS, CGC  I was immediately available and supervising. Erasmo Score, MD [facility]

## 2017-08-31 LAB — PROGESTERONE: PROGESTERONE: 25.2 ng/mL

## 2017-09-03 ENCOUNTER — Telehealth: Payer: Self-pay | Admitting: Obstetrics and Gynecology

## 2017-09-03 ENCOUNTER — Encounter: Payer: BC Managed Care – PPO | Admitting: Obstetrics and Gynecology

## 2017-09-03 ENCOUNTER — Other Ambulatory Visit: Payer: BC Managed Care – PPO

## 2017-09-03 NOTE — Telephone Encounter (Signed)
I left a message for Alisha Vincent that her progesterone levels were back and required no follow up treatment.  The level drawn on 08/30/2017 was 25.2 ng/mL. Per Dr. Dellia Nims' recommendations, treatment was only required if the level was less than 20 ng/mL.  I also left our number for her to call with any questions.  We may be reached at [phone].  Wilburt Finlay, MS, CGC

## 2017-10-04 NOTE — Addendum Note (Signed)
Encounter addended by: Dellia Nims, MD on: 10/04/2017 8:57 AM  Actions taken: Sign clinical note

## 2017-12-12 ENCOUNTER — Ambulatory Visit: Payer: BC Managed Care – PPO

## 2017-12-12 ENCOUNTER — Other Ambulatory Visit: Payer: BC Managed Care – PPO

## 2017-12-14 ENCOUNTER — Ambulatory Visit: Payer: BC Managed Care – PPO | Admitting: Oncology

## 2018-01-11 ENCOUNTER — Other Ambulatory Visit: Payer: Self-pay | Admitting: *Deleted

## 2018-01-11 DIAGNOSIS — O99019 Anemia complicating pregnancy, unspecified trimester: Secondary | ICD-10-CM

## 2018-01-11 NOTE — Progress Notes (Signed)
cbc

## 2018-01-21 DIAGNOSIS — O99019 Anemia complicating pregnancy, unspecified trimester: Secondary | ICD-10-CM | POA: Insufficient documentation

## 2018-01-21 NOTE — Progress Notes (Signed)
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 Now in remission.  Anemia in pregnancy.  INTERVAL HISTORY: Patient is referred back to clinic by OB/GYN after being noted to be anemic on routine blood work during pregnancy.  She currently feels well and is asymptomatic.  She is gaining weight appropriately.  She has no neurologic complaints. She denies any fevers, night sweats, or weight loss. She denies any pain. She has no chest pain or shortness of breath. She denies any nausea, vomiting, constipation, or diarrhea. She has no urinary complaints.  Patient offers no specific complaints today.  REVIEW OF SYSTEMS:   Review of Systems  Constitutional: Negative for fever, malaise/fatigue and weight loss.  HENT: Negative.   Respiratory: Negative.  Negative for cough and shortness of breath.   Cardiovascular: Negative.  Negative for chest pain and leg swelling.  Gastrointestinal: Negative.  Negative for abdominal pain.  Genitourinary: Negative.   Musculoskeletal: Negative.   Skin: Negative.  Negative for rash.  Neurological: Negative.  Negative for weakness.  Endo/Heme/Allergies: Does not bruise/bleed easily.  Psychiatric/Behavioral: Negative.  The patient is not nervous/anxious.     As per HPI. Otherwise, a complete review of systems is negative.  PAST MEDICAL HISTORY: Past Medical History:  Diagnosis Date  . Encounter for fertility preservation procedure Nov 2012  . Hodgkin's disease, unspecified type, of lymph nodes of multiple sites     PAST SURGICAL HISTORY: Past Surgical  History:  Procedure Laterality Date  . LYMPH NODE BIOPSY  2012  . PORTA CATH REMOVAL N/A 07/19/2017   Procedure: PORTA CATH REMOVAL;  Surgeon: Algernon Huxley, MD;  Location: [facility];  Service: Cardiovascular;  Laterality: N/A;  . PORTACATH PLACEMENT  Oct 2012  . wisdom teeth removal   AGE 31    FAMILY HISTORY: Reviewed and unchanged. No reported history of malignancy or chronic disease.     ADVANCED DIRECTIVES:    HEALTH MAINTENANCE: Social History   Tobacco Use  . Smoking status: Never Smoker  . Smokeless tobacco: Never Used  Substance Use Topics  . Alcohol use: Yes  . Drug use: No     Colonoscopy:  PAP:  Bone density:  Lipid panel:  Allergies  Allergen Reactions  . Tape Rash    Current Outpatient Medications  Medication Sig Dispense Refill  . acetaminophen (TYLENOL) 325 MG tablet Take 650 mg by mouth every 6 (six) hours as needed (for pain.).    Marland Kitchen dexmethylphenidate (FOCALIN) 10 MG tablet Take 10 mg by mouth 2 (two) times daily.     . Prenatal Multivit-Min-Fe-FA (PRENATAL 1 + IRON PO) Take 1 capsule by mouth daily.    . SUMAtriptan (IMITREX) 100 MG tablet Take 100 mg by mouth every 2 (two) hours as needed (for pain.).      No current facility-administered medications for this visit.    Facility-Administered Medications Ordered in Other Visits  Medication Dose Route Frequency Provider Last Rate Last Dose  . sodium chloride flush (NS) 0.9 % injection 10 mL  10 mL Intravenous PRN Lloyd Huger, MD   10 mL at 03/14/17 1044    OBJECTIVE: Vitals:  01/24/18 1411 01/24/18 1417  BP:  115/82  Pulse:  97  Resp: 12   Temp:  98.2 F (36.8 C)     Body mass index is 30.91 kg/m.    ECOG FS:0 - Asymptomatic  General: Well-developed, well-nourished, no acute distress. Eyes: Pink conjunctiva, anicteric sclera. HEENT: Normocephalic, moist mucous membranes, clear oropharnyx.  No palpable lymphadenopathy. Lungs: Clear to auscultation bilaterally. Heart:  Regular rate and rhythm. No rubs, murmurs, or gallops. Abdomen: Appears appropriate for gestational age. Musculoskeletal: No edema, cyanosis, or clubbing. Neuro: Alert, answering all questions appropriately. Cranial nerves grossly intact. Skin: No rashes or petechiae noted. Psych: Normal affect.   LAB RESULTS:  Lab Results  Component Value Date   NA 138 03/14/2017   K 3.6 03/14/2017   CL 108 03/14/2017   CO2 23 03/14/2017   GLUCOSE 132 (H) 03/14/2017   BUN 15 03/14/2017   CREATININE 0.88 03/14/2017   CALCIUM 8.9 03/14/2017   PROT 6.6 03/14/2017   ALBUMIN 3.9 03/14/2017   AST 18 03/14/2017   ALT 14 03/14/2017   ALKPHOS 57 03/14/2017   BILITOT 1.3 (H) 03/14/2017   GFRNONAA >60 03/14/2017   GFRAA >60 03/14/2017    Lab Results  Component Value Date   WBC 8.3 01/24/2018   NEUTROABS 5.4 01/24/2018   HGB 11.4 (L) 01/24/2018   HCT 32.4 (L) 01/24/2018   MCV 99.7 01/24/2018   PLT 293 01/24/2018     STUDIES: No results found.  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 Now in remission. Anemia in pregnancy.  PLAN:   1. Anemia in pregnancy: Patient recently initiated oral iron supplementation and now has an improvement in her hemoglobin to 11.4.  She remains iron deficient, but does not require IV Feraheme at this time.  B12 and folate are within normal limits.  No intervention is needed at this time.  Patient reports that she is having an induction on March 27, 2018, therefore she will return to clinic approximately 1 week prior to that for repeat laboratory work and further evaluation. 2.  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  She continues to remain in complete remission. Previously, after lengthy discussion, patient did not receive maintenance  brentuximab. Patient has now completed her posttransplant vaccines and continues to follow up yearly with her transplant team.  We will hold off any further imaging until patient gets birth at the end of August.  Will consider repeat imaging in November or December 2019. 3.  Pregnancy: Patient due date is April 03, 2018, but she reports she will likely have induction on March 27, 2018.  Continue monitoring per OB/GYN.  I spent a total of 30 minutes face-to-face with the patient of which greater than 50% of the visit was spent in counseling and coordination of care as summarized above.   Transplant physician: Dr. Arnell Sieving- Office: [phone], Cell: [phone]. OB/GYN physician: Dr. Jeneen Rinks with [facility].  Patient expressed understanding and was in agreement with this plan. She also understands that She can call clinic at any time with any questions, concerns, or complaints.    Lloyd Huger, MD   01/27/2018 10:56 AM

## 2018-01-23 ENCOUNTER — Other Ambulatory Visit: Payer: Self-pay | Admitting: Oncology

## 2018-01-24 ENCOUNTER — Inpatient Hospital Stay: Payer: BC Managed Care – PPO

## 2018-01-24 ENCOUNTER — Other Ambulatory Visit: Payer: Self-pay

## 2018-01-24 ENCOUNTER — Encounter: Payer: Self-pay | Admitting: Oncology

## 2018-01-24 ENCOUNTER — Inpatient Hospital Stay (HOSPITAL_BASED_OUTPATIENT_CLINIC_OR_DEPARTMENT_OTHER): Payer: BC Managed Care – PPO | Admitting: Oncology

## 2018-01-24 ENCOUNTER — Inpatient Hospital Stay: Payer: BC Managed Care – PPO | Attending: Oncology

## 2018-01-24 DIAGNOSIS — Z8571 Personal history of Hodgkin lymphoma: Secondary | ICD-10-CM

## 2018-01-24 DIAGNOSIS — Z9481 Bone marrow transplant status: Secondary | ICD-10-CM

## 2018-01-24 DIAGNOSIS — Z331 Pregnant state, incidental: Secondary | ICD-10-CM | POA: Diagnosis not present

## 2018-01-24 DIAGNOSIS — D509 Iron deficiency anemia, unspecified: Secondary | ICD-10-CM | POA: Insufficient documentation

## 2018-01-24 DIAGNOSIS — O99013 Anemia complicating pregnancy, third trimester: Secondary | ICD-10-CM

## 2018-01-24 DIAGNOSIS — O99019 Anemia complicating pregnancy, unspecified trimester: Secondary | ICD-10-CM

## 2018-01-24 LAB — FOLATE: Folate: 35 ng/mL (ref >5.9)

## 2018-01-24 LAB — IRON AND TIBC
Iron: 51 ug/dL (ref 28–170)
SATURATION RATIOS: 11 % (ref 10.4–31.8)
TIBC: 461 ug/dL — ABNORMAL HIGH (ref 250–450)
UIBC: 410 ug/dL

## 2018-01-24 LAB — CBC WITH DIFFERENTIAL/PLATELET
Basophils Absolute: 0 10*3/uL (ref 0–0.1)
Basophils Relative: 0 %
EOS ABS: 0.1 10*3/uL (ref 0–0.7)
Eosinophils Relative: 1 %
HEMATOCRIT: 32.4 % — AB (ref 35.0–47.0)
HEMOGLOBIN: 11.4 g/dL — AB (ref 12.0–16.0)
LYMPHS ABS: 2.4 10*3/uL (ref 1.0–3.6)
Lymphocytes Relative: 29 %
MCH: 35.1 pg — ABNORMAL HIGH (ref 26.0–34.0)
MCHC: 35.2 g/dL (ref 32.0–36.0)
MCV: 99.7 fL (ref 80.0–100.0)
Monocytes Absolute: 0.4 10*3/uL (ref 0.2–0.9)
Monocytes Relative: 5 %
NEUTROS ABS: 5.4 10*3/uL (ref 1.4–6.5)
NEUTROS PCT: 65 %
Platelets: 293 10*3/uL (ref 150–440)
RBC: 3.24 MIL/uL — AB (ref 3.80–5.20)
RDW: 13.3 % (ref 11.5–14.5)
WBC: 8.3 10*3/uL (ref 3.6–11.0)

## 2018-01-24 LAB — VITAMIN B12: VITAMIN B 12: 235 pg/mL (ref 180–914)

## 2018-01-24 LAB — FERRITIN: Ferritin: 7 ng/mL — ABNORMAL LOW (ref 11–307)

## 2018-01-24 NOTE — Progress Notes (Signed)
Patient here for anemia. She reports being short of breath, increased tiredness.

## 2018-03-19 ENCOUNTER — Inpatient Hospital Stay: Payer: BC Managed Care – PPO

## 2018-03-19 ENCOUNTER — Inpatient Hospital Stay: Payer: BC Managed Care – PPO | Admitting: Oncology

## 2018-04-12 ENCOUNTER — Ambulatory Visit
Admission: RE | Admit: 2018-04-12 | Discharge: 2018-04-12 | Disposition: A | Discharge status: Home / Self Care | Payer: BC Managed Care – PPO | Source: Ambulatory Visit | Attending: Pediatrics | Admitting: Pediatrics

## 2018-04-12 NOTE — Lactation Note (Signed)
Lactation Consultation Note  Patient Name: Alisha Vincent NHAFB'X Date: 04/12/2018  2 wk old baby, low milk supply, can pump only 15 cc from both breasts combined, using nipple shield to latch baby to breast,   BW 6-8, 6-2 on 9-5 at [place], 6.08 today at [facility] office without diaper  Maternal Data  Started using nipple shield at [facility], states baby was not pulling tongue out well to latch to breast, sl flat nipples, pumps after most feedings , has been taking Fenugreek for a week  Feeding  Baby very fussy at breast, would not latch without shield and then would only latch if formula was injected into nipple shield, then nursed 15 min on left breast with some swallows and small amt of breastmilk noted in shield, needed stimulation to suck, took in 12cc per prepost wt  nursed approx 5 min on right breast, tired easily, attempted nursing with SNS, with slow feeding and only took in 20 cc   Formula, took  0.5 oz formula by bottle, noted baby unable to attain a good suck pattern at breast, letting suction go frequently , unable to attain appropriate suction on gloved finger, humps tongue up in back of mouth, does not cup tongue, could not visualize frenulum as baby sucked on tongue at each attempt   LATCH Score                   Interventions    Lactation Tools Discussed/Used  Given SNS to try at home but parents forgot and left here, given children's denistry of gsbo handout to potentially call for frenulum eval, or make appt with Dr. Jeannine Kitten for check, give baby 2.5- 3 oz EBM and formula each feeding to increase wt and strength, try breastfeeding with shield 2-3 times a day but continue to pump to increase milk supply in meantime     Consult Status  Return next Friday, Sept 20 for [facility] recheck at [place] 04/12/2018, 8:02 PM

## 2018-04-23 ENCOUNTER — Telehealth: Payer: Self-pay | Admitting: Lactation Services

## 2018-05-10 ENCOUNTER — Other Ambulatory Visit: Payer: BC Managed Care – PPO

## 2018-05-10 ENCOUNTER — Ambulatory Visit: Payer: BC Managed Care – PPO | Admitting: Oncology

## 2018-05-12 NOTE — Progress Notes (Signed)
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 Now in remission.  INTERVAL HISTORY: Patient returns to clinic today for routine yearly evaluation and discussion of her imaging results.  She currently feels well and is asymptomatic. She has no neurologic complaints. She denies any fevers, night sweats, or weight loss. She denies any pain. She has no chest pain or shortness of breath. She denies any nausea, vomiting, constipation, or diarrhea. She has no urinary complaints.  Patient feels at her baseline offers no specific complaints today.  REVIEW OF SYSTEMS:   Review of Systems  Constitutional: Negative for fever, malaise/fatigue and weight loss.  HENT: Negative.   Respiratory: Negative.  Negative for cough and shortness of breath.   Cardiovascular: Negative.  Negative for chest pain and leg swelling.  Gastrointestinal: Negative.  Negative for abdominal pain.  Genitourinary: Negative.   Musculoskeletal: Negative.   Skin: Negative.  Negative for rash.  Neurological: Negative.  Negative for weakness.  Endo/Heme/Allergies: Does not bruise/bleed easily.  Psychiatric/Behavioral: Negative.  The patient is not nervous/anxious.     As per HPI. Otherwise, a complete review of systems is negative.  PAST MEDICAL HISTORY: Past Medical History:  Diagnosis Date  . Encounter for fertility preservation procedure Nov 2012  . Hodgkin's disease, unspecified type, of lymph nodes of multiple sites     PAST SURGICAL HISTORY: Past Surgical History:  Procedure Laterality Date  . LYMPH NODE BIOPSY   2012  . PORTA CATH REMOVAL N/A 07/19/2017   Procedure: PORTA CATH REMOVAL;  Surgeon: Algernon Huxley, MD;  Location: [facility];  Service: Cardiovascular;  Laterality: N/A;  . PORTACATH PLACEMENT  Oct 2012  . wisdom teeth removal   AGE 27    FAMILY HISTORY: Reviewed and unchanged. No reported history of malignancy or chronic disease.     ADVANCED DIRECTIVES:    HEALTH MAINTENANCE: Social History   Tobacco Use  . Smoking status: Never Smoker  . Smokeless tobacco: Never Used  Substance Use Topics  . Alcohol use: Yes  . Drug use: No     Colonoscopy:  PAP:  Bone density:  Lipid panel:  Allergies  Allergen Reactions  . Tape Rash    Current Outpatient Medications  Medication Sig Dispense Refill  . acetaminophen (TYLENOL) 325 MG tablet Take 650 mg by mouth every 6 (six) hours as needed (for pain.).    Marland Kitchen dexmethylphenidate (FOCALIN) 10 MG tablet Take 10 mg by mouth 2 (two) times daily.     . Prenatal Multivit-Min-Fe-FA (PRENATAL 1 + IRON PO) Take 1 capsule by mouth daily.    . SUMAtriptan (IMITREX) 100 MG tablet Take 100 mg by mouth every 2 (two) hours as needed (for pain.).      No current facility-administered medications for this visit.    Facility-Administered Medications Ordered in Other Visits  Medication Dose Route Frequency Provider Last Rate Last Dose  . sodium chloride flush (NS) 0.9 % injection 10 mL  10 mL Intravenous PRN Lloyd Huger, MD   10 mL at 03/14/17 1044    OBJECTIVE: Vitals:   05/15/18 1044  BP: 107/73  Pulse: 62  Resp: 18  Temp: (!) 97.2 F (36.2 C)     There is no height or weight on file to calculate BMI.    ECOG FS:0 - Asymptomatic  General: Well-developed, well-nourished, no acute distress. Eyes: Pink conjunctiva, anicteric sclera. HEENT: Normocephalic, moist mucous membranes, clear oropharnyx.  No palpable lymphadenopathy. Lungs: Clear to auscultation bilaterally. Heart: Regular rate and rhythm. No rubs, murmurs, or  gallops. Abdomen: Soft, nontender, nondistended. No organomegaly noted, normoactive bowel sounds. Musculoskeletal: No edema, cyanosis, or clubbing. Neuro: Alert, answering all questions appropriately. Cranial nerves grossly intact. Skin: No rashes or petechiae noted. Psych: Normal affect.   LAB RESULTS:  Lab Results  Component Value Date   NA 136 05/15/2018   K 4.0 05/15/2018   CL 105 05/15/2018   CO2 24 05/15/2018   GLUCOSE 94 05/15/2018   BUN 14 05/15/2018   CREATININE 0.96 05/15/2018   CALCIUM 9.1 05/15/2018   PROT 7.0 05/15/2018   ALBUMIN 3.9 05/15/2018   AST 28 05/15/2018   ALT 30 05/15/2018   ALKPHOS 70 05/15/2018   BILITOT 0.8 05/15/2018   GFRNONAA >60 05/15/2018   GFRAA >60 05/15/2018    Lab Results  Component Value Date   WBC 4.8 05/15/2018   NEUTROABS 2.4 05/15/2018   HGB 11.3 (L) 05/15/2018   HCT 34.9 (L) 05/15/2018   MCV 96.1 05/15/2018   PLT 296 05/15/2018     STUDIES: Ct Soft Tissue Neck W Contrast  Result Date: 05/13/2018 CLINICAL DATA:  27 year old female with history of treated Hodgkin's lymphoma in 2012. Surveillance, follow-up of 2018 neck CT findings. EXAM: CT NECK WITH CONTRAST TECHNIQUE: Multidetector CT imaging of the neck was performed using the standard protocol following the bolus administration of intravenous contrast. CONTRAST:  161mL ISOVUE-300 IOPAMIDOL (ISOVUE-300) INJECTION 61% in conjunction with contrast enhanced imaging of the chest, abdomen, and pelvis reported separately. COMPARISON:  Neck CT 06/22/2017 and earlier. FINDINGS: Pharynx and larynx: Normal laryngeal contours. Pharyngeal soft tissue contours remain within normal limits. Negative parapharyngeal and retropharyngeal spaces. Salivary glands: Negative sublingual space. Bilateral submandibular glands and parotid glands are symmetric and normal. Thyroid: Negative. Lymph nodes: Bilateral level 2A lymph nodes measuring 6-7 millimeter short axis remain stable since 2018 (series 1  images 38 and 40). At the same time bilateral level 2B lymph nodes have regressed since 2018 (series 1, image 35 today). Other nodal stations remain normal. No increased or heterogeneous lymph nodes. Vascular: Major vascular structures in the neck and at the skull base are patent and remain normal. Limited intracranial: Negative. Visualized orbits: Negative. Mastoids and visualized paranasal sinuses: Decreased maxillary sinus mucosal thickening and/or retention cysts since 2018. Other visualized paranasal sinuses and mastoids are stable and well pneumatized. Skeleton: No acute or suspicious osseous lesion. Upper chest: Reported separately. IMPRESSION: 1. Normal CT appearance of the neck. Physiologic appearance of stable or regressed bilateral level 2 lymph nodes since the 2018 Neck CT. 2.  CT Chest, Abdomen, and Pelvis today are reported separately. Electronically Signed   By: Genevie Ann M.D.   On: 05/13/2018 13:45   Ct Chest W Contrast  Result Date: 05/13/2018 CLINICAL DATA:  27 year old female with history of Hodgkin's lymphoma diagnosed 4 years ago. Follow-up study. EXAM: CT CHEST, ABDOMEN, AND PELVIS WITH CONTRAST TECHNIQUE: Multidetector CT imaging of the chest, abdomen and pelvis was performed following the standard protocol during bolus administration of intravenous contrast. CONTRAST:  138mL ISOVUE-300 IOPAMIDOL (ISOVUE-300) INJECTION 61% COMPARISON:  CT the chest, abdomen and pelvis 06/22/2017. FINDINGS: CT CHEST FINDINGS Cardiovascular: Heart  size is normal. There is no significant pericardial fluid, thickening or pericardial calcification. No atherosclerotic calcifications noted in the thoracic aorta or the coronary arteries. Mediastinum/Nodes: No pathologically enlarged mediastinal or hilar lymph nodes. Esophagus is unremarkable in appearance. No axillary lymphadenopathy. Lungs/Pleura: No suspicious appearing pulmonary nodules or masses. No acute consolidative airspace disease. No pleural effusions.  Musculoskeletal: There are no aggressive appearing lytic or blastic lesions noted in the visualized portions of the skeleton. CT ABDOMEN PELVIS FINDINGS Hepatobiliary: No suspicious cystic or solid hepatic lesions. No intra or extrahepatic biliary ductal dilatation. Gallbladder is normal in appearance. Pancreas: No pancreatic mass. No pancreatic ductal dilatation. No pancreatic or peripancreatic fluid or inflammatory changes. Spleen: Unremarkable. Adrenals/Urinary Tract: 3 mm nonobstructive calculus in the lower pole collecting system of left kidney. Kidneys and bilateral adrenal glands are otherwise normal in appearance. No hydroureteronephrosis. Urinary bladder is normal in appearance. Stomach/Bowel: Normal appearance of the stomach. No pathologic dilatation of small bowel or colon. A few scattered colonic diverticulae are noted, without surrounding inflammatory changes to suggest an acute diverticulitis at this time. Normal appendix. Vascular/Lymphatic: No significant atherosclerotic disease, aneurysm or dissection noted in the abdominal or pelvic vasculature. No lymphadenopathy noted in the abdomen or pelvis. Reproductive: Uterus and ovaries are unremarkable in appearance. Other: No significant volume of ascites.  No pneumoperitoneum. Musculoskeletal: There are no aggressive appearing lytic or blastic lesions noted in the visualized portions of the skeleton. IMPRESSION: 1. No lymphadenopathy noted in the chest, abdomen or pelvis to suggest recurrent disease. 2. Spleen is normal in size. 3. 3 mm nonobstructive calculus in the lower pole collecting system of left kidney. 4. Mild colonic diverticulosis without evidence of acute diverticulitis at this time. Electronically Signed   By: Vinnie Langton M.D.   On: 05/13/2018 14:05   Ct Abdomen Pelvis W Contrast  Result Date: 05/13/2018 CLINICAL DATA:  27 year old female with history of Hodgkin's lymphoma diagnosed 4 years ago. Follow-up study. EXAM: CT CHEST,  ABDOMEN, AND PELVIS WITH CONTRAST TECHNIQUE: Multidetector CT imaging of the chest, abdomen and pelvis was performed following the standard protocol during bolus administration of intravenous contrast. CONTRAST:  110mL ISOVUE-300 IOPAMIDOL (ISOVUE-300) INJECTION 61% COMPARISON:  CT the chest, abdomen and pelvis 06/22/2017. FINDINGS: CT CHEST FINDINGS Cardiovascular: Heart size is normal. There is no significant pericardial fluid, thickening or pericardial calcification. No atherosclerotic calcifications noted in the thoracic aorta or the coronary arteries. Mediastinum/Nodes: No pathologically enlarged mediastinal or hilar lymph nodes. Esophagus is unremarkable in appearance. No axillary lymphadenopathy. Lungs/Pleura: No suspicious appearing pulmonary nodules or masses. No acute consolidative airspace disease. No pleural effusions. Musculoskeletal: There are no aggressive appearing lytic or blastic lesions noted in the visualized portions of the skeleton. CT ABDOMEN PELVIS FINDINGS Hepatobiliary: No suspicious cystic or solid hepatic lesions. No intra or extrahepatic biliary ductal dilatation. Gallbladder is normal in appearance. Pancreas: No pancreatic mass. No pancreatic ductal dilatation. No pancreatic or peripancreatic fluid or inflammatory changes. Spleen: Unremarkable. Adrenals/Urinary Tract: 3 mm nonobstructive calculus in the lower pole collecting system of left kidney. Kidneys and bilateral adrenal glands are otherwise normal in appearance. No hydroureteronephrosis. Urinary bladder is normal in appearance. Stomach/Bowel: Normal appearance of the stomach. No pathologic dilatation of small bowel or colon. A few scattered colonic diverticulae are noted, without surrounding inflammatory changes to suggest an acute diverticulitis at this time. Normal appendix. Vascular/Lymphatic: No significant atherosclerotic disease, aneurysm or dissection noted in the abdominal or pelvic vasculature. No lymphadenopathy noted  in the abdomen or pelvis. Reproductive: Uterus and  ovaries are unremarkable in appearance. Other: No significant volume of ascites.  No pneumoperitoneum. Musculoskeletal: There are no aggressive appearing lytic or blastic lesions noted in the visualized portions of the skeleton. IMPRESSION: 1. No lymphadenopathy noted in the chest, abdomen or pelvis to suggest recurrent disease. 2. Spleen is normal in size. 3. 3 mm nonobstructive calculus in the lower pole collecting system of left kidney. 4. Mild colonic diverticulosis without evidence of acute diverticulitis at this time. Electronically Signed   By: Vinnie Langton M.D.   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 Now in remission.   PLAN:   1.  Recurrent Hodgkin's disease, status post bone marrow transplant on August 03, 2014: Patient's most recent CT scans on May 13, 2018 reviewed independently and report as above with no evidence to suggest recurrent or progressive disease.  She continues to remain in complete remission. Previously, after lengthy discussion, patient did not receive maintenance brentuximab. Patient has now completed her posttransplant vaccines and continues to follow up yearly with her transplant team.  No further intervention is needed.  Return to clinic in 6 months for laboratory work only and then in 1 year with repeat imaging and further evaluation. 2.  Anemia: Patient's hemoglobin is mildly decreased, but stable at 11.3.  She did not require IV Feraheme during her pregnancy. 3.  Pregnancy: Patient had a healthy baby girl at the end of August.  No reported issues or complications.  I spent a total of 30 minutes face-to-face with the patient of which greater than 50% of the visit was spent in counseling and coordination of care as detailed above.  Transplant physician: Dr. Arnell Sieving- Office: [phone], Cell: [phone]. OB/GYN physician: Dr.  Jeneen Rinks with [facility].  Patient expressed understanding and was in agreement with this plan. She also understands that She can call clinic at any time with any questions, concerns, or complaints.    Lloyd Huger, MD   05/16/2018 4:41 PM

## 2018-05-13 ENCOUNTER — Encounter: Payer: Self-pay | Admitting: Radiology

## 2018-05-13 ENCOUNTER — Ambulatory Visit
Admission: RE | Admit: 2018-05-13 | Discharge: 2018-05-13 | Disposition: A | Discharge status: Home / Self Care | Payer: BC Managed Care – PPO | Source: Ambulatory Visit | Attending: Oncology | Admitting: Oncology

## 2018-05-13 DIAGNOSIS — C8190 Hodgkin lymphoma, unspecified, unspecified site: Secondary | ICD-10-CM | POA: Diagnosis not present

## 2018-05-13 DIAGNOSIS — N200 Calculus of kidney: Secondary | ICD-10-CM | POA: Diagnosis not present

## 2018-05-13 DIAGNOSIS — K5730 Diverticulosis of large intestine without perforation or abscess without bleeding: Secondary | ICD-10-CM | POA: Diagnosis not present

## 2018-05-13 MED ORDER — IOPAMIDOL (ISOVUE-300) INJECTION 61%
100.0000 mL | Freq: Once | INTRAVENOUS | Status: AC | PRN
  Administered 2018-05-13: 100 mL via INTRAVENOUS

## 2018-05-15 ENCOUNTER — Inpatient Hospital Stay: Payer: BC Managed Care – PPO | Attending: Oncology | Admitting: Oncology

## 2018-05-15 ENCOUNTER — Other Ambulatory Visit: Payer: Self-pay

## 2018-05-15 ENCOUNTER — Encounter: Payer: Self-pay | Admitting: Oncology

## 2018-05-15 ENCOUNTER — Inpatient Hospital Stay: Payer: BC Managed Care – PPO

## 2018-05-15 VITALS — BP 107/73 | HR 62 | Temp 97.2°F | Resp 18

## 2018-05-15 DIAGNOSIS — Z9481 Bone marrow transplant status: Secondary | ICD-10-CM | POA: Insufficient documentation

## 2018-05-15 DIAGNOSIS — O99013 Anemia complicating pregnancy, third trimester: Secondary | ICD-10-CM

## 2018-05-15 DIAGNOSIS — K5730 Diverticulosis of large intestine without perforation or abscess without bleeding: Secondary | ICD-10-CM

## 2018-05-15 DIAGNOSIS — Z79899 Other long term (current) drug therapy: Secondary | ICD-10-CM | POA: Diagnosis not present

## 2018-05-15 DIAGNOSIS — D649 Anemia, unspecified: Secondary | ICD-10-CM | POA: Diagnosis not present

## 2018-05-15 DIAGNOSIS — C8118 Nodular sclerosis classical Hodgkin lymphoma, lymph nodes of multiple sites: Secondary | ICD-10-CM

## 2018-05-15 DIAGNOSIS — N200 Calculus of kidney: Secondary | ICD-10-CM | POA: Diagnosis not present

## 2018-05-15 DIAGNOSIS — Z8571 Personal history of Hodgkin lymphoma: Secondary | ICD-10-CM

## 2018-05-15 LAB — CBC WITH DIFFERENTIAL/PLATELET
ABS IMMATURE GRANULOCYTES: 0.02 10*3/uL (ref 0.00–0.07)
BASOS ABS: 0 10*3/uL (ref 0.0–0.1)
Basophils Relative: 0 %
Eosinophils Absolute: 0.1 10*3/uL (ref 0.0–0.5)
Eosinophils Relative: 3 %
HCT: 34.9 % — ABNORMAL LOW (ref 36.0–46.0)
Hemoglobin: 11.3 g/dL — ABNORMAL LOW (ref 12.0–15.0)
Immature Granulocytes: 0 %
Lymphocytes Relative: 40 %
Lymphs Abs: 1.9 10*3/uL (ref 0.7–4.0)
MCH: 31.1 pg (ref 26.0–34.0)
MCHC: 32.4 g/dL (ref 30.0–36.0)
MCV: 96.1 fL (ref 80.0–100.0)
MONO ABS: 0.3 10*3/uL (ref 0.1–1.0)
Monocytes Relative: 6 %
NEUTROS ABS: 2.4 10*3/uL (ref 1.7–7.7)
Neutrophils Relative %: 51 %
PLATELETS: 296 10*3/uL (ref 150–400)
RBC: 3.63 MIL/uL — AB (ref 3.87–5.11)
RDW: 13.4 % (ref 11.5–15.5)
WBC: 4.8 10*3/uL (ref 4.0–10.5)
nRBC: 0 % (ref 0.0–0.2)

## 2018-05-15 LAB — COMPREHENSIVE METABOLIC PANEL
ALK PHOS: 70 U/L (ref 38–126)
ALT: 30 U/L (ref 0–44)
ANION GAP: 7 (ref 5–15)
AST: 28 U/L (ref 15–41)
Albumin: 3.9 g/dL (ref 3.5–5.0)
BILIRUBIN TOTAL: 0.8 mg/dL (ref 0.3–1.2)
BUN: 14 mg/dL (ref 6–20)
CO2: 24 mmol/L (ref 22–32)
Calcium: 9.1 mg/dL (ref 8.9–10.3)
Chloride: 105 mmol/L (ref 98–111)
Creatinine, Ser: 0.96 mg/dL (ref 0.44–1.00)
GFR calc Af Amer: >60 mL/min (ref >60)
GFR calc non Af Amer: >60 mL/min (ref >60)
GLUCOSE: 94 mg/dL (ref 70–99)
POTASSIUM: 4 mmol/L (ref 3.5–5.1)
Sodium: 136 mmol/L (ref 135–145)
Total Protein: 7 g/dL (ref 6.5–8.1)

## 2018-05-15 LAB — LACTATE DEHYDROGENASE: LDH: 119 U/L (ref 98–192)

## 2018-05-15 LAB — IRON AND TIBC
Iron: 36 ug/dL (ref 28–170)
Saturation Ratios: 9 % — ABNORMAL LOW (ref 10.4–31.8)
TIBC: 397 ug/dL (ref 250–450)
UIBC: 361 ug/dL

## 2018-05-15 LAB — FERRITIN: FERRITIN: 5 ng/mL — AB (ref 11–307)

## 2018-05-15 NOTE — Progress Notes (Signed)
Patient denies any concerns today.  

## 2018-11-20 ENCOUNTER — Other Ambulatory Visit: Payer: BC Managed Care – PPO

## 2018-12-24 ENCOUNTER — Encounter: Payer: Self-pay | Admitting: Oncology

## 2018-12-25 NOTE — Progress Notes (Signed)
Order(s) created erroneously. Erroneous order ID: [id number]  Order moved by: Berneice Gandy  Order move date/time: 12/25/2018 10:29 AM  Source Patient: [id number]  Source Contact: 12/24/2018  Destination Patient: [id number]  Destination Contact: 10/15/2012

## 2019-05-08 ENCOUNTER — Other Ambulatory Visit: Payer: Self-pay | Admitting: *Deleted

## 2019-05-08 DIAGNOSIS — O99013 Anemia complicating pregnancy, third trimester: Secondary | ICD-10-CM

## 2019-05-12 ENCOUNTER — Other Ambulatory Visit: Payer: Self-pay

## 2019-05-12 NOTE — Progress Notes (Signed)
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 Now in remission.  Anemia in pregnancy.  INTERVAL HISTORY: Patient returns to clinic today for further evaluation and discussion of IV Feraheme.  She is now on her third master of pregnancy and has a progressively declining hemoglobin which is now 8.7.  She currently feels well is asymptomatic.  She is gaining weight appropriately. She has no neurologic complaints. She denies any fevers, night sweats, or weight loss. She denies any pain.  She denies any chest pain, shortness of breath, cough, or hemoptysis.  She denies any nausea, vomiting, constipation, or diarrhea. She has no urinary complaints.  Patient feels at her baseline and offers no specific complaints today.  REVIEW OF SYSTEMS:   Review of Systems  Constitutional: Negative for fever, malaise/fatigue and weight loss.  HENT: Negative.   Respiratory: Negative.  Negative for cough and shortness of breath.   Cardiovascular: Negative.  Negative for chest pain and leg swelling.  Gastrointestinal: Negative.  Negative for abdominal pain.  Genitourinary: Negative.   Musculoskeletal: Negative.   Skin: Negative.  Negative for rash.  Neurological: Negative.  Negative for weakness.  Endo/Heme/Allergies: Does not bruise/bleed easily.  Psychiatric/Behavioral: Negative.  The patient is not nervous/anxious.     As per HPI. Otherwise, a complete review of systems is negative.  PAST MEDICAL HISTORY: Past Medical History:  Diagnosis Date  . Encounter for fertility preservation procedure Nov  2012  . Hodgkin's disease, unspecified type, of lymph nodes of multiple sites     PAST SURGICAL HISTORY: Past Surgical History:  Procedure Laterality Date  . LYMPH NODE BIOPSY  2012  . PORTA CATH REMOVAL N/A 07/19/2017   Procedure: PORTA CATH REMOVAL;  Surgeon: Algernon Huxley, MD;  Location: [facility];  Service: Cardiovascular;  Laterality: N/A;  . PORTACATH PLACEMENT  Oct 2012  . wisdom teeth removal   AGE 1    FAMILY HISTORY: Reviewed and unchanged. No reported history of malignancy or chronic disease.     ADVANCED DIRECTIVES:    HEALTH MAINTENANCE: Social History   Tobacco Use  . Smoking status: Never Smoker  . Smokeless tobacco: Never Used  Substance Use Topics  . Alcohol use: Yes  . Drug use: No     Colonoscopy:  PAP:  Bone density:  Lipid panel:  Allergies  Allergen Reactions  . Tape Rash    Current Outpatient Medications  Medication Sig Dispense Refill  . acetaminophen (TYLENOL) 325 MG tablet Take 650 mg by mouth every 6 (six) hours as needed (for pain.).    Marland Kitchen dexmethylphenidate (FOCALIN) 10 MG tablet Take 10 mg by mouth 2 (two) times daily.     . famotidine (PEPCID) 20 MG tablet     . Prenatal Multivit-Min-Fe-FA (PRENATAL 1 + IRON PO) Take 1 capsule by mouth daily.    . QC CHILDRENS ASPIRIN 81 MG chewable tablet     . SUMAtriptan (IMITREX) 100 MG tablet Take 100 mg by mouth every 2 (two) hours as needed (for pain.).      No current facility-administered medications for this visit.    Facility-Administered Medications Ordered in Other Visits  Medication  Dose Route Frequency Provider Last Rate Last Dose  . sodium chloride flush (NS) 0.9 % injection 10 mL  10 mL Intravenous PRN Lloyd Huger, MD   10 mL at 03/14/17 1044    OBJECTIVE: Vitals:   05/13/19 1407  BP: 113/70  Pulse: 85  Resp: 18  Temp: (!) 97.4 F (36.3 C)     Body mass index is 33.64 kg/m.    ECOG FS:0 - Asymptomatic  General: Well-developed, well-nourished, no acute  distress. Eyes: Pink conjunctiva, anicteric sclera. HEENT: Normocephalic, moist mucous membranes, clear oropharnyx.  No palpable lymphadenopathy. Lungs: Clear to auscultation bilaterally. Heart: Regular rate and rhythm. No rubs, murmurs, or gallops. Abdomen: Appears appropriate for gestational age. Musculoskeletal: No edema, cyanosis, or clubbing. Neuro: Alert, answering all questions appropriately. Cranial nerves grossly intact. Skin: No rashes or petechiae noted. Psych: Normal affect.  LAB RESULTS:  Lab Results  Component Value Date   NA 136 05/15/2018   K 4.0 05/15/2018   CL 105 05/15/2018   CO2 24 05/15/2018   GLUCOSE 94 05/15/2018   BUN 14 05/15/2018   CREATININE 0.96 05/15/2018   CALCIUM 9.1 05/15/2018   PROT 7.0 05/15/2018   ALBUMIN 3.9 05/15/2018   AST 28 05/15/2018   ALT 30 05/15/2018   ALKPHOS 70 05/15/2018   BILITOT 0.8 05/15/2018   GFRNONAA >60 05/15/2018   GFRAA >60 05/15/2018    Lab Results  Component Value Date   WBC 8.9 05/13/2019   NEUTROABS 6.1 05/13/2019   HGB 8.9 (L) 05/13/2019   HCT 27.6 (L) 05/13/2019   MCV 96.5 05/13/2019   PLT 253 05/13/2019   Lab Results  Component Value Date   IRON 60 05/13/2019   TIBC 551 (H) 05/13/2019   IRONPCTSAT 11 05/13/2019   Lab Results  Component Value Date   FERRITIN 6 (L) 05/13/2019     STUDIES: No results found.  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 Now in remission.   PLAN:   1.  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  She continues to remain in complete remission. Previously, after lengthy discussion, patient did not receive maintenance brentuximab. Patient has now completed her posttransplant vaccines and continues to follow up yearly with her transplant team.  No  further intervention is needed.  Although patient's repeat CT scan was scheduled for October 2020, this will be delayed until January 2021 after she gives birth. 2.  Anemia in pregnancy: Patient has a significantly reduced hemoglobin and ferritin, therefore we will proceed with 510 mg IV Feraheme today.  Return to clinic in 1 week for second infusion.  Patient will then return to clinic first week of December prior to her due date for repeat laboratory work and further evaluation. 3.  Pregnancy: Patient reports she is having a baby boy and is due July 17, 2019.   I spent a total of 30 minutes face-to-face with the patient of which greater than 50% of the visit was spent in counseling and coordination of care as detailed above.   Transplant physician: Dr. Arnell Sieving- Office: [phone], Cell: [phone]. OB/GYN physician: Dr. Jeneen Rinks with [facility].  Patient expressed understanding and was in agreement with this plan. She also understands that She can call clinic at any time with any questions, concerns, or complaints.    Kathlene November  Grayland Ormond, MD   05/15/2019 6:47 AM

## 2019-05-12 NOTE — Progress Notes (Signed)
Called patient to prechart office visit.  Patient denies any concerns

## 2019-05-13 ENCOUNTER — Inpatient Hospital Stay: Payer: BC Managed Care – PPO | Attending: Oncology

## 2019-05-13 ENCOUNTER — Inpatient Hospital Stay: Payer: BC Managed Care – PPO

## 2019-05-13 ENCOUNTER — Inpatient Hospital Stay (HOSPITAL_BASED_OUTPATIENT_CLINIC_OR_DEPARTMENT_OTHER): Payer: BC Managed Care – PPO | Admitting: Oncology

## 2019-05-13 ENCOUNTER — Other Ambulatory Visit: Payer: Self-pay

## 2019-05-13 VITALS — BP 113/70 | HR 85 | Temp 97.4°F | Resp 18 | Wt 208.4 lb

## 2019-05-13 VITALS — BP 112/69 | HR 75 | Temp 99.0°F | Resp 18

## 2019-05-13 DIAGNOSIS — Z8571 Personal history of Hodgkin lymphoma: Secondary | ICD-10-CM | POA: Diagnosis not present

## 2019-05-13 DIAGNOSIS — O99013 Anemia complicating pregnancy, third trimester: Secondary | ICD-10-CM | POA: Insufficient documentation

## 2019-05-13 DIAGNOSIS — Z9481 Bone marrow transplant status: Secondary | ICD-10-CM | POA: Insufficient documentation

## 2019-05-13 DIAGNOSIS — Z7982 Long term (current) use of aspirin: Secondary | ICD-10-CM | POA: Insufficient documentation

## 2019-05-13 DIAGNOSIS — D649 Anemia, unspecified: Secondary | ICD-10-CM | POA: Insufficient documentation

## 2019-05-13 LAB — CBC WITH DIFFERENTIAL/PLATELET
Abs Immature Granulocytes: 0.16 10*3/uL — ABNORMAL HIGH (ref 0.00–0.07)
Basophils Absolute: 0 10*3/uL (ref 0.0–0.1)
Basophils Relative: 0 %
Eosinophils Absolute: 0.1 10*3/uL (ref 0.0–0.5)
Eosinophils Relative: 1 %
HCT: 27.6 % — ABNORMAL LOW (ref 36.0–46.0)
Hemoglobin: 8.9 g/dL — ABNORMAL LOW (ref 12.0–15.0)
Immature Granulocytes: 2 %
Lymphocytes Relative: 24 %
Lymphs Abs: 2.1 10*3/uL (ref 0.7–4.0)
MCH: 31.1 pg (ref 26.0–34.0)
MCHC: 32.2 g/dL (ref 30.0–36.0)
MCV: 96.5 fL (ref 80.0–100.0)
Monocytes Absolute: 0.4 10*3/uL (ref 0.1–1.0)
Monocytes Relative: 5 %
Neutro Abs: 6.1 10*3/uL (ref 1.7–7.7)
Neutrophils Relative %: 68 %
Platelets: 253 10*3/uL (ref 150–400)
RBC: 2.86 MIL/uL — ABNORMAL LOW (ref 3.87–5.11)
RDW: 14.1 % (ref 11.5–15.5)
WBC: 8.9 10*3/uL (ref 4.0–10.5)
nRBC: 0 % (ref 0.0–0.2)

## 2019-05-13 LAB — IRON AND TIBC
Iron: 60 ug/dL (ref 28–170)
Saturation Ratios: 11 % (ref 10.4–31.8)
TIBC: 551 ug/dL — ABNORMAL HIGH (ref 250–450)
UIBC: 491 ug/dL

## 2019-05-13 LAB — FERRITIN: Ferritin: 6 ng/mL — ABNORMAL LOW (ref 11–307)

## 2019-05-13 MED ORDER — SODIUM CHLORIDE 0.9 % IV SOLN
510.0000 mg | Freq: Once | INTRAVENOUS | Status: AC
  Administered 2019-05-13: 510 mg via INTRAVENOUS
  Filled 2019-05-13: qty 17

## 2019-05-13 MED ORDER — SODIUM CHLORIDE 0.9 % IV SOLN
Freq: Once | INTRAVENOUS | Status: AC
  Administered 2019-05-13: 15:00:00 via INTRAVENOUS
  Filled 2019-05-13: qty 250

## 2019-05-19 ENCOUNTER — Inpatient Hospital Stay: Payer: BC Managed Care – PPO

## 2019-05-19 ENCOUNTER — Other Ambulatory Visit: Payer: BC Managed Care – PPO

## 2019-05-19 ENCOUNTER — Ambulatory Visit: Payer: BC Managed Care – PPO

## 2019-05-19 ENCOUNTER — Other Ambulatory Visit: Payer: Self-pay

## 2019-05-19 VITALS — BP 108/65 | HR 77 | Temp 98.7°F | Resp 18

## 2019-05-19 DIAGNOSIS — O99013 Anemia complicating pregnancy, third trimester: Secondary | ICD-10-CM | POA: Diagnosis not present

## 2019-05-19 MED ORDER — SODIUM CHLORIDE 0.9 % IV SOLN
Freq: Once | INTRAVENOUS | Status: AC
  Administered 2019-05-19: 14:00:00 via INTRAVENOUS
  Filled 2019-05-19: qty 250

## 2019-05-19 MED ORDER — SODIUM CHLORIDE 0.9 % IV SOLN
510.0000 mg | Freq: Once | INTRAVENOUS | Status: AC
  Administered 2019-05-19: 510 mg via INTRAVENOUS
  Filled 2019-05-19: qty 17

## 2019-05-22 ENCOUNTER — Ambulatory Visit: Payer: BC Managed Care – PPO | Admitting: Oncology

## 2019-05-22 ENCOUNTER — Encounter: Payer: Self-pay | Admitting: Oncology

## 2019-06-25 NOTE — Progress Notes (Signed)
[place]  [phone]) [phone] [phone]) [phone]  ID: Alisha Vincent OB: 01/15/1991  MR#: [id number]  [id number]  Patient Care Team: Rusty Aus, MD as PCP - General (Unknown Physician Specialty) Bary Castilla Forest Gleason, MD as Consulting Physician (General Surgery) Lloyd Huger, MD as Consulting Physician (Oncology)  I connected with Alisha Vincent on 07/04/19 at  1:30 PM EST by video enabled telemedicine visit and verified that I am speaking with the correct person using two identifiers.   I discussed the limitations, risks, security and privacy concerns of performing an evaluation and management service by telemedicine and the availability of in-person appointments. I also discussed with the patient that there may be a patient responsible charge related to this service. The patient expressed understanding and agreed to proceed.   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 Now in remission.  Anemia in pregnancy.  INTERVAL HISTORY: Patient agreed to video enabled telemedicine visit for further evaluation and discussion of her laboratory work.  She currently feels well and is asymptomatic. She is gaining weight appropriately. She has no neurologic complaints. She denies any fevers, night sweats, or weight loss. She denies any pain.  She denies any chest pain, shortness of breath, cough, or hemoptysis.  She denies any nausea, vomiting, constipation, or diarrhea. She has no urinary complaints.  Patient offers no specific complaints today.  REVIEW OF SYSTEMS:   Review of Systems  Constitutional: Negative for fever, malaise/fatigue and weight loss.  HENT: Negative.   Respiratory: Negative.  Negative for cough and shortness of breath.   Cardiovascular: Negative.   Negative for chest pain and leg swelling.  Gastrointestinal: Negative.  Negative for abdominal pain.  Genitourinary: Negative.   Musculoskeletal: Negative.   Skin: Negative.  Negative for rash.  Neurological: Negative.  Negative for weakness.  Endo/Heme/Allergies: Does not bruise/bleed easily.  Psychiatric/Behavioral: Negative.  The patient is not nervous/anxious.     As per HPI. Otherwise, a complete review of systems is negative.  PAST MEDICAL HISTORY: Past Medical History:  Diagnosis Date  . Encounter for fertility preservation procedure Nov 2012  . Hodgkin's disease, unspecified type, of lymph nodes of multiple sites     PAST SURGICAL HISTORY: Past Surgical History:  Procedure Laterality Date  . LYMPH NODE BIOPSY  2012  . PORTA CATH REMOVAL N/A 07/19/2017   Procedure: PORTA CATH REMOVAL;  Surgeon: Algernon Huxley, MD;  Location: [facility];  Service: Cardiovascular;  Laterality: N/A;  . PORTACATH PLACEMENT  Oct 2012  . wisdom teeth removal   AGE 61    FAMILY HISTORY: Reviewed and unchanged. No reported history of malignancy or chronic disease.     ADVANCED DIRECTIVES:    HEALTH MAINTENANCE: Social History   Tobacco Use  . Smoking status: Never Smoker  . Smokeless tobacco: Never Used  Substance Use Topics  . Alcohol use: Yes  . Drug use: No     Colonoscopy:  PAP:  Bone density:  Lipid panel:  Allergies  Allergen Reactions  . Tape Rash    Current Outpatient Medications  Medication Sig Dispense Refill  . famotidine (PEPCID) 20 MG tablet     . Prenatal Multivit-Min-Fe-FA (PRENATAL 1 + IRON PO) Take 1 capsule by mouth daily.    . QC CHILDRENS ASPIRIN 81 MG chewable tablet     .  acetaminophen (TYLENOL) 325 MG tablet Take 650 mg by mouth every 6 (six) hours as needed (for pain.).    Marland Kitchen dexmethylphenidate (FOCALIN) 10 MG tablet Take 10 mg by mouth 2 (two) times daily.     . SUMAtriptan (IMITREX) 100 MG tablet Take 100 mg by mouth every 2 (two) hours  as needed (for pain.).      No current facility-administered medications for this visit.    Facility-Administered Medications Ordered in Other Visits  Medication Dose Route Frequency Provider Last Rate Last Dose  . sodium chloride flush (NS) 0.9 % injection 10 mL  10 mL Intravenous PRN Lloyd Huger, MD   10 mL at 03/14/17 1044    OBJECTIVE: There were no vitals filed for this visit.   There is no height or weight on file to calculate BMI.    ECOG FS:0 - Asymptomatic  General: Well-developed, well-nourished, no acute distress. HEENT: Normocephalic. Neuro: Alert, answering all questions appropriately. Cranial nerves grossly intact. Psych: Normal affect.  LAB RESULTS:  Lab Results  Component Value Date   NA 136 05/15/2018   K 4.0 05/15/2018   CL 105 05/15/2018   CO2 24 05/15/2018   GLUCOSE 94 05/15/2018   BUN 14 05/15/2018   CREATININE 0.96 05/15/2018   CALCIUM 9.1 05/15/2018   PROT 7.0 05/15/2018   ALBUMIN 3.9 05/15/2018   AST 28 05/15/2018   ALT 30 05/15/2018   ALKPHOS 70 05/15/2018   BILITOT 0.8 05/15/2018   GFRNONAA >60 05/15/2018   GFRAA >60 05/15/2018    Lab Results  Component Value Date   WBC 8.0 07/03/2019   NEUTROABS 5.3 07/03/2019   HGB 10.1 (L) 07/03/2019   HCT 31.6 (L) 07/03/2019   MCV 103.3 (H) 07/03/2019   PLT 228 07/03/2019   Lab Results  Component Value Date   IRON 113 07/03/2019   TIBC 405 07/03/2019   IRONPCTSAT 28 07/03/2019   Lab Results  Component Value Date   FERRITIN 38 07/03/2019     STUDIES: No results found.  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 Now in remission.   PLAN:   1.  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  She continues to remain in complete remission. Previously, after  lengthy discussion, patient did not receive maintenance brentuximab. Patient has now completed her posttransplant vaccines and continues to follow up yearly with her transplant team.  No further intervention is needed.  Although patient's repeat CT scan was scheduled for October 2020, will reschedule in 3 months prior to her next clinic appointment. 2.  Anemia in pregnancy: Patient's hemoglobin has significantly improved and is now 10.1.  Iron stores are within normal limits.  She does not require additional IV Feraheme.  Return to clinic in 3 months with repeat laboratory work, repeat imaging as above, and video assisted telemedicine visit. 3.  Pregnancy: Patient reports she is having a baby boy and is due July 17, 2019.   I provided 25 minutes of face-to-face video visit time during this encounter, and > 50% was spent counseling as documented under my assessment & plan.   Transplant physician: Dr. Arnell Sieving- Office: [phone], Cell: [phone]. OB/GYN physician: Dr. Jeneen Rinks with [facility].  Patient expressed understanding and was in agreement with this plan. She also understands that She can call clinic at any  time with any questions, concerns, or complaints.    Lloyd Huger, MD   07/04/2019 1:06 PM

## 2019-07-03 ENCOUNTER — Other Ambulatory Visit: Payer: Self-pay

## 2019-07-03 ENCOUNTER — Inpatient Hospital Stay (HOSPITAL_BASED_OUTPATIENT_CLINIC_OR_DEPARTMENT_OTHER): Payer: BC Managed Care – PPO | Admitting: Oncology

## 2019-07-03 ENCOUNTER — Inpatient Hospital Stay: Payer: BC Managed Care – PPO | Attending: Oncology

## 2019-07-03 ENCOUNTER — Ambulatory Visit: Payer: BC Managed Care – PPO

## 2019-07-03 DIAGNOSIS — Z79899 Other long term (current) drug therapy: Secondary | ICD-10-CM | POA: Insufficient documentation

## 2019-07-03 DIAGNOSIS — Z862 Personal history of diseases of the blood and blood-forming organs and certain disorders involving the immune mechanism: Secondary | ICD-10-CM | POA: Diagnosis not present

## 2019-07-03 DIAGNOSIS — D649 Anemia, unspecified: Secondary | ICD-10-CM | POA: Insufficient documentation

## 2019-07-03 DIAGNOSIS — Z7982 Long term (current) use of aspirin: Secondary | ICD-10-CM | POA: Diagnosis not present

## 2019-07-03 DIAGNOSIS — Z9481 Bone marrow transplant status: Secondary | ICD-10-CM | POA: Insufficient documentation

## 2019-07-03 DIAGNOSIS — Z8571 Personal history of Hodgkin lymphoma: Secondary | ICD-10-CM | POA: Diagnosis not present

## 2019-07-03 DIAGNOSIS — O99013 Anemia complicating pregnancy, third trimester: Secondary | ICD-10-CM | POA: Insufficient documentation

## 2019-07-03 DIAGNOSIS — C8118 Nodular sclerosis classical Hodgkin lymphoma, lymph nodes of multiple sites: Secondary | ICD-10-CM | POA: Diagnosis not present

## 2019-07-03 LAB — CBC WITH DIFFERENTIAL/PLATELET
Abs Immature Granulocytes: 0.16 10*3/uL — ABNORMAL HIGH (ref 0.00–0.07)
Basophils Absolute: 0 10*3/uL (ref 0.0–0.1)
Basophils Relative: 0 %
Eosinophils Absolute: 0.1 10*3/uL (ref 0.0–0.5)
Eosinophils Relative: 1 %
HCT: 31.6 % — ABNORMAL LOW (ref 36.0–46.0)
Hemoglobin: 10.1 g/dL — ABNORMAL LOW (ref 12.0–15.0)
Immature Granulocytes: 2 %
Lymphocytes Relative: 25 %
Lymphs Abs: 2 10*3/uL (ref 0.7–4.0)
MCH: 33 pg (ref 26.0–34.0)
MCHC: 32 g/dL (ref 30.0–36.0)
MCV: 103.3 fL — ABNORMAL HIGH (ref 80.0–100.0)
Monocytes Absolute: 0.5 10*3/uL (ref 0.1–1.0)
Monocytes Relative: 6 %
Neutro Abs: 5.3 10*3/uL (ref 1.7–7.7)
Neutrophils Relative %: 66 %
Platelets: 228 10*3/uL (ref 150–400)
RBC: 3.06 MIL/uL — ABNORMAL LOW (ref 3.87–5.11)
RDW: 17.6 % — ABNORMAL HIGH (ref 11.5–15.5)
WBC: 8 10*3/uL (ref 4.0–10.5)
nRBC: 0 % (ref 0.0–0.2)

## 2019-07-03 LAB — IRON AND TIBC
Iron: 113 ug/dL (ref 28–170)
Saturation Ratios: 28 % (ref 10.4–31.8)
TIBC: 405 ug/dL (ref 250–450)
UIBC: 292 ug/dL

## 2019-07-03 LAB — FERRITIN: Ferritin: 38 ng/mL (ref 11–307)

## 2019-07-03 NOTE — Progress Notes (Signed)
Pt reports feeling well, 2 weeks away from delivery, had vertigo in the morning for about 2 weeks, stopped approx a week ago.

## 2019-09-27 NOTE — Progress Notes (Signed)
[place]  [phone] [phone]) [phone]  ID: Alisha Vincent OB: [birth year]  MR#: [id number]  [id number]  Patient Care Team: Rusty Aus, MD as PCP - General (Unknown Physician Specialty) Bary Castilla Forest Gleason, MD as Consulting Physician (General Surgery) Lloyd Huger, MD as Consulting Physician (Oncology)  I connected with Alisha Vincent on 10/03/19 at  2:45 PM EST by video enabled telemedicine visit and verified that I am speaking with the correct person using two identifiers.   I discussed the limitations, risks, security and privacy concerns of performing an evaluation and management service by telemedicine and the availability of in-person appointments. I also discussed with the patient that there may be a patient responsible charge related to this service. The patient expressed understanding and agreed to proceed.   Other persons participating in the visit and their role in the encounter: Patient, MD.  Patient's location: Home. Provider's location: Clinic.   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 Now in remission.  History of iron deficiency anemia in pregnancy.  INTERVAL HISTORY: Patient agreed to video enabled telemedicine visit for further evaluation and discussion of her imaging results.  Appointment was concluded over the telephone secondary to technical difficulties.  She continues to feel well and remains asymptomatic.  She is now back to work full-time.  She has no neurologic complaints. She denies any fevers, night sweats, or weight loss. She denies any pain.  She denies any chest pain, shortness of breath, cough, or hemoptysis.  She denies any nausea, vomiting, constipation, or diarrhea. She has no urinary complaints.  Patient offers no specific complaints today.  REVIEW OF SYSTEMS:   Review of Systems  Constitutional: Negative for fever, malaise/fatigue and weight  loss.  HENT: Negative.   Respiratory: Negative.  Negative for cough and shortness of breath.   Cardiovascular: Negative.  Negative for chest pain and leg swelling.  Gastrointestinal: Negative.  Negative for abdominal pain.  Genitourinary: Negative.   Musculoskeletal: Negative.   Skin: Negative.  Negative for rash.  Neurological: Negative.  Negative for weakness.  Endo/Heme/Allergies: Does not bruise/bleed easily.  Psychiatric/Behavioral: Negative.  The patient is not nervous/anxious.     As per HPI. Otherwise, a complete review of systems is negative.  PAST MEDICAL HISTORY: Past Medical History:  Diagnosis Date  . Encounter for fertility preservation procedure Nov 2012  . Hodgkin's disease, unspecified type, of lymph nodes of multiple sites     PAST SURGICAL HISTORY: Past Surgical History:  Procedure Laterality Date  . LYMPH NODE BIOPSY  2012  . PORTA CATH REMOVAL N/A 07/19/2017   Procedure: PORTA CATH REMOVAL;  Surgeon: Algernon Huxley, MD;  Location: [facility];  Service: Cardiovascular;  Laterality: N/A;  . PORTACATH PLACEMENT  Oct 2012  . wisdom teeth removal   AGE 29    FAMILY HISTORY: Reviewed and unchanged. No reported history of malignancy or chronic disease.     ADVANCED DIRECTIVES:    HEALTH MAINTENANCE: Social History   Tobacco Use  . Smoking status: Never Smoker  . Smokeless tobacco: Never Used  Substance Use Topics  . Alcohol use: Yes  . Drug use: No     Colonoscopy:  PAP:  Bone density:  Lipid panel:  Allergies  Allergen Reactions  . Tape Rash    Current Outpatient Medications  Medication Sig Dispense Refill  . acetaminophen (TYLENOL) 325 MG tablet Take 650 mg by mouth every 6 (six) hours as  needed (for pain.).    Marland Kitchen dexmethylphenidate (FOCALIN) 10 MG tablet Take 10 mg by mouth 2 (two) times daily.     . SUMAtriptan (IMITREX) 100 MG tablet Take 100 mg by mouth every 2 (two) hours as needed (for pain.).      No current  facility-administered medications for this visit.   Facility-Administered Medications Ordered in Other Visits  Medication Dose Route Frequency Provider Last Rate Last Admin  . sodium chloride flush (NS) 0.9 % injection 10 mL  10 mL Intravenous PRN Lloyd Huger, MD   10 mL at 03/14/17 1044    OBJECTIVE: There were no vitals filed for this visit.   There is no height or weight on file to calculate BMI.    ECOG FS:0 - Asymptomatic  General: Well-developed, well-nourished, no acute distress. HEENT: Normocephalic. Neuro: Alert, answering all questions appropriately. Cranial nerves grossly intact. Psych: Normal affect.  LAB RESULTS:  Lab Results  Component Value Date   NA 136 05/15/2018   K 4.0 05/15/2018   CL 105 05/15/2018   CO2 24 05/15/2018   GLUCOSE 94 05/15/2018   BUN 14 05/15/2018   CREATININE 0.96 05/15/2018   CALCIUM 9.1 05/15/2018   PROT 7.0 05/15/2018   ALBUMIN 3.9 05/15/2018   AST 28 05/15/2018   ALT 30 05/15/2018   ALKPHOS 70 05/15/2018   BILITOT 0.8 05/15/2018   GFRNONAA >60 05/15/2018   GFRAA >60 05/15/2018    Lab Results  Component Value Date   WBC 8.0 07/03/2019   NEUTROABS 5.3 07/03/2019   HGB 10.1 (L) 07/03/2019   HCT 31.6 (L) 07/03/2019   MCV 103.3 (H) 07/03/2019   PLT 228 07/03/2019   Lab Results  Component Value Date   IRON 111 10/01/2019   TIBC 385 10/01/2019   IRONPCTSAT 29 10/01/2019   Lab Results  Component Value Date   FERRITIN 46 10/01/2019     STUDIES: CT SOFT TISSUE NECK W CONTRAST  Result Date: 10/01/2019 CLINICAL DATA:  Surveillance of Hodgkin's lymphoma. EXAM: CT NECK WITH CONTRAST TECHNIQUE: Multidetector CT imaging of the neck was performed using the standard protocol following the bolus administration of intravenous contrast. CONTRAST:  134mL OMNIPAQUE IOHEXOL 300 MG/ML  SOLN COMPARISON:  05/13/2018 FINDINGS: Pharynx and larynx: No evidence of mass or swelling. Salivary glands: No inflammation, mass, or stone. Thyroid:  Unremarkable. Lymph nodes: No enlarged or suspicious lymph nodes in the neck. Vascular: Major vascular structures of the neck are grossly patent. Limited intracranial: Unremarkable. Visualized orbits: Unremarkable. Mastoids and visualized paranasal sinuses: Left larger than right maxillary sinus mucous retention cysts. Clear mastoid air cells. Skeleton: No acute osseous abnormality or suspicious osseous lesion. Upper chest: Reported separately. Other: None. IMPRESSION: Negative neck CT.  No evidence of recurrent lymphoma. Electronically Signed   By: Logan Bores M.D.   On: 10/01/2019 11:09   CT CHEST W CONTRAST  Result Date: 10/01/2019 CLINICAL DATA:  Restaging Hodgkin's lymphoma. EXAM: CT CHEST, ABDOMEN, AND PELVIS WITH CONTRAST TECHNIQUE: Multidetector CT imaging of the chest, abdomen and pelvis was performed following the standard protocol during bolus administration of intravenous contrast. CONTRAST:  18mL OMNIPAQUE IOHEXOL 300 MG/ML  SOLN COMPARISON:  05/13/2018 FINDINGS: CT CHEST FINDINGS Cardiovascular: The heart size is normal. No pericardial effusion identified. Mediastinum/Nodes: No enlarged mediastinal, hilar, or axillary lymph nodes. Thyroid gland, trachea, and esophagus demonstrate no significant findings. Lungs/Pleura: No pleural effusion identified. Lungs are clear. No airspace consolidation, atelectasis or pneumothorax. Musculoskeletal: No chest wall mass or suspicious bone lesions  identified. CT ABDOMEN PELVIS FINDINGS Hepatobiliary: No focal liver abnormality is seen. No gallstones, gallbladder wall thickening, or biliary dilatation. Pancreas: Unremarkable. No pancreatic ductal dilatation or surrounding inflammatory changes. Spleen: Normal in size without focal abnormality. Adrenals/Urinary Tract: Normal adrenal glands. Bilateral kidney stones are identified. The largest stone is in the inferior pole of the left kidney measuring 5 mm. No mass or hydronephrosis identified bilaterally. The  urinary bladder is unremarkable. Stomach/Bowel: Stomach is within normal limits. Appendix appears normal. No evidence of bowel wall thickening, distention, or inflammatory changes. Vascular/Lymphatic: No significant vascular findings are present. No enlarged abdominal or pelvic lymph nodes. Reproductive: Uterus and bilateral adnexa are unremarkable. Other: No free fluid or fluid collections. Musculoskeletal: No acute or significant osseous findings. IMPRESSION: 1. No acute findings within the chest, abdomen or pelvis. No mass or adenopathy identified to suggest residual/recurrent lymphoma. 2. Bilateral nonobstructing renal calculi.  The Electronically Signed   By: Kerby Moors M.D.   On: 10/01/2019 10:40   CT Abdomen Pelvis W Contrast  Result Date: 10/01/2019 CLINICAL DATA:  Restaging Hodgkin's lymphoma. EXAM: CT CHEST, ABDOMEN, AND PELVIS WITH CONTRAST TECHNIQUE: Multidetector CT imaging of the chest, abdomen and pelvis was performed following the standard protocol during bolus administration of intravenous contrast. CONTRAST:  136mL OMNIPAQUE IOHEXOL 300 MG/ML  SOLN COMPARISON:  05/13/2018 FINDINGS: CT CHEST FINDINGS Cardiovascular: The heart size is normal. No pericardial effusion identified. Mediastinum/Nodes: No enlarged mediastinal, hilar, or axillary lymph nodes. Thyroid gland, trachea, and esophagus demonstrate no significant findings. Lungs/Pleura: No pleural effusion identified. Lungs are clear. No airspace consolidation, atelectasis or pneumothorax. Musculoskeletal: No chest wall mass or suspicious bone lesions identified. CT ABDOMEN PELVIS FINDINGS Hepatobiliary: No focal liver abnormality is seen. No gallstones, gallbladder wall thickening, or biliary dilatation. Pancreas: Unremarkable. No pancreatic ductal dilatation or surrounding inflammatory changes. Spleen: Normal in size without focal abnormality. Adrenals/Urinary Tract: Normal adrenal glands. Bilateral kidney stones are identified. The  largest stone is in the inferior pole of the left kidney measuring 5 mm. No mass or hydronephrosis identified bilaterally. The urinary bladder is unremarkable. Stomach/Bowel: Stomach is within normal limits. Appendix appears normal. No evidence of bowel wall thickening, distention, or inflammatory changes. Vascular/Lymphatic: No significant vascular findings are present. No enlarged abdominal or pelvic lymph nodes. Reproductive: Uterus and bilateral adnexa are unremarkable. Other: No free fluid or fluid collections. Musculoskeletal: No acute or significant osseous findings. IMPRESSION: 1. No acute findings within the chest, abdomen or pelvis. No mass or adenopathy identified to suggest residual/recurrent lymphoma. 2. Bilateral nonobstructing renal calculi.  The Electronically Signed   By: Kerby Moors M.D.   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 Now in remission.   PLAN:   1.  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  Her most recent imaging on October 01, 2019 reviewed independently and reported as above continues to report complete remission without evidence of recurrent or progressive disease. Previously, after lengthy discussion, patient did not receive maintenance brentuximab.  No intervention is needed at this time.  Return to clinic in 1 year with repeat imaging and further evaluation.  At this point patient will be nearly 6 years removed from her transplant and possibly could be discharged from clinic. 2.  Iron deficiency anemia: Patient found to have significantly decreased hemoglobin and iron stores during  pregnancy.  Hemoglobin at outside clinic was reported as normal limits.  Iron panel also within normal limits.  No further intervention is needed at this time.   3.   Pregnancy: Patient gave birth to a healthy baby boy, Sabra Heck, in December 2020.  I provided 20 minutes of face-to-face video visit time during this encounter which included chart review, counseling, and coordination of care as documented above.   Transplant physician: Dr. Arnell Sieving- Office: [phone], Cell: [phone]. OB/GYN physician: Dr. Jeneen Rinks with [facility].  Patient expressed understanding and was in agreement with this plan. She also understands that She can call clinic at any time with any questions, concerns, or complaints.    Lloyd Huger, MD   10/03/2019 4:45 PM

## 2019-09-30 ENCOUNTER — Other Ambulatory Visit: Payer: Self-pay

## 2019-10-01 ENCOUNTER — Ambulatory Visit
Admission: RE | Admit: 2019-10-01 | Discharge: 2019-10-01 | Disposition: A | Discharge status: Home / Self Care | Payer: BC Managed Care – PPO | Source: Ambulatory Visit | Attending: Oncology | Admitting: Oncology

## 2019-10-01 ENCOUNTER — Inpatient Hospital Stay: Payer: BC Managed Care – PPO | Attending: Oncology

## 2019-10-01 ENCOUNTER — Other Ambulatory Visit: Payer: Self-pay

## 2019-10-01 DIAGNOSIS — Z9481 Bone marrow transplant status: Secondary | ICD-10-CM | POA: Insufficient documentation

## 2019-10-01 DIAGNOSIS — C8118 Nodular sclerosis classical Hodgkin lymphoma, lymph nodes of multiple sites: Secondary | ICD-10-CM | POA: Diagnosis not present

## 2019-10-01 DIAGNOSIS — Z8571 Personal history of Hodgkin lymphoma: Secondary | ICD-10-CM | POA: Diagnosis not present

## 2019-10-01 DIAGNOSIS — O99013 Anemia complicating pregnancy, third trimester: Secondary | ICD-10-CM

## 2019-10-01 LAB — IRON AND TIBC
Iron: 111 ug/dL (ref 28–170)
Saturation Ratios: 29 % (ref 10.4–31.8)
TIBC: 385 ug/dL (ref 250–450)
UIBC: 274 ug/dL

## 2019-10-01 LAB — FERRITIN: Ferritin: 46 ng/mL (ref 11–307)

## 2019-10-01 MED ORDER — IOHEXOL 300 MG/ML  SOLN
100.0000 mL | Freq: Once | INTRAMUSCULAR | Status: AC | PRN
  Administered 2019-10-01: 100 mL via INTRAVENOUS

## 2019-10-02 ENCOUNTER — Encounter: Payer: Self-pay | Admitting: Oncology

## 2019-10-02 ENCOUNTER — Inpatient Hospital Stay (HOSPITAL_BASED_OUTPATIENT_CLINIC_OR_DEPARTMENT_OTHER): Payer: BC Managed Care – PPO | Admitting: Oncology

## 2019-10-02 DIAGNOSIS — C8118 Nodular sclerosis classical Hodgkin lymphoma, lymph nodes of multiple sites: Secondary | ICD-10-CM

## 2019-10-02 NOTE — Progress Notes (Signed)
Patient prescreened for appointment. Patient has no concerns or questions.  

## 2020-09-30 ENCOUNTER — Other Ambulatory Visit: Payer: Self-pay

## 2020-09-30 ENCOUNTER — Emergency Department
Admission: EM | Admit: 2020-09-30 | Discharge: 2020-10-01 | Disposition: A | Discharge status: Home / Self Care | Payer: BC Managed Care – PPO | Attending: Emergency Medicine | Admitting: Emergency Medicine

## 2020-09-30 DIAGNOSIS — Z8571 Personal history of Hodgkin lymphoma: Secondary | ICD-10-CM | POA: Diagnosis not present

## 2020-09-30 DIAGNOSIS — R112 Nausea with vomiting, unspecified: Secondary | ICD-10-CM | POA: Diagnosis present

## 2020-09-30 DIAGNOSIS — R109 Unspecified abdominal pain: Secondary | ICD-10-CM | POA: Diagnosis not present

## 2020-09-30 DIAGNOSIS — E860 Dehydration: Secondary | ICD-10-CM | POA: Diagnosis not present

## 2020-09-30 DIAGNOSIS — R4182 Altered mental status, unspecified: Secondary | ICD-10-CM | POA: Diagnosis not present

## 2020-09-30 MED ORDER — SODIUM CHLORIDE 0.9 % IV BOLUS
1000.0000 mL | Freq: Once | INTRAVENOUS | Status: AC
  Administered 2020-09-30: 1000 mL via INTRAVENOUS

## 2020-09-30 MED ORDER — ONDANSETRON HCL 4 MG/2ML IJ SOLN
4.0000 mg | Freq: Once | INTRAMUSCULAR | Status: AC
  Administered 2020-09-30: 4 mg via INTRAVENOUS
  Filled 2020-09-30: qty 2

## 2020-09-30 NOTE — ED Triage Notes (Signed)
Pt BIB EMS, experiencing vomiting and diarrhea today, given 4mg  zofran with EMS with relief. BP 80/50 with EMS, EMS had multiple failed IV attempts. Pt visibly anxious, tearful. Denies pain.

## 2020-10-01 ENCOUNTER — Inpatient Hospital Stay: Payer: BC Managed Care – PPO

## 2020-10-01 ENCOUNTER — Emergency Department: Payer: BC Managed Care – PPO

## 2020-10-01 ENCOUNTER — Inpatient Hospital Stay: Payer: BC Managed Care – PPO | Attending: Oncology

## 2020-10-01 ENCOUNTER — Other Ambulatory Visit: Payer: Self-pay | Admitting: *Deleted

## 2020-10-01 ENCOUNTER — Ambulatory Visit: Admission: RE | Admit: 2020-10-01 | Payer: BC Managed Care – PPO | Source: Ambulatory Visit

## 2020-10-01 DIAGNOSIS — R112 Nausea with vomiting, unspecified: Secondary | ICD-10-CM

## 2020-10-01 LAB — COMPREHENSIVE METABOLIC PANEL
ALT: 16 U/L (ref 0–44)
ALT: 16 U/L (ref 0–44)
AST: 23 U/L (ref 15–41)
AST: 32 U/L (ref 15–41)
Albumin: 4.2 g/dL (ref 3.5–5.0)
Albumin: 5 g/dL (ref 3.5–5.0)
Alkaline Phosphatase: 67 U/L (ref 38–126)
Alkaline Phosphatase: 84 U/L (ref 38–126)
Anion gap: 13 (ref 5–15)
Anion gap: 16 — ABNORMAL HIGH (ref 5–15)
BUN: 22 mg/dL — ABNORMAL HIGH (ref 6–20)
BUN: 24 mg/dL — ABNORMAL HIGH (ref 6–20)
CO2: 17 mmol/L — ABNORMAL LOW (ref 22–32)
CO2: 19 mmol/L — ABNORMAL LOW (ref 22–32)
Calcium: 10.4 mg/dL — ABNORMAL HIGH (ref 8.9–10.3)
Calcium: 8.4 mg/dL — ABNORMAL LOW (ref 8.9–10.3)
Chloride: 102 mmol/L (ref 98–111)
Chloride: 107 mmol/L (ref 98–111)
Creatinine, Ser: 1.22 mg/dL — ABNORMAL HIGH (ref 0.44–1.00)
Creatinine, Ser: 1.28 mg/dL — ABNORMAL HIGH (ref 0.44–1.00)
GFR, Estimated: 57 mL/min — ABNORMAL LOW (ref >60)
GFR, Estimated: >60 mL/min (ref >60)
Glucose, Bld: 147 mg/dL — ABNORMAL HIGH (ref 70–99)
Glucose, Bld: 94 mg/dL (ref 70–99)
Potassium: 3.7 mmol/L (ref 3.5–5.1)
Potassium: 4.5 mmol/L (ref 3.5–5.1)
Sodium: 134 mmol/L — ABNORMAL LOW (ref 135–145)
Sodium: 140 mmol/L (ref 135–145)
Total Bilirubin: 1.9 mg/dL — ABNORMAL HIGH (ref 0.3–1.2)
Total Bilirubin: 2 mg/dL — ABNORMAL HIGH (ref 0.3–1.2)
Total Protein: 7.6 g/dL (ref 6.5–8.1)
Total Protein: 9 g/dL — ABNORMAL HIGH (ref 6.5–8.1)

## 2020-10-01 LAB — CBC WITH DIFFERENTIAL/PLATELET
Abs Immature Granulocytes: 0.06 10*3/uL (ref 0.00–0.07)
Abs Immature Granulocytes: 0.1 10*3/uL — ABNORMAL HIGH (ref 0.00–0.07)
Basophils Absolute: 0 10*3/uL (ref 0.0–0.1)
Basophils Absolute: 0 10*3/uL (ref 0.0–0.1)
Basophils Relative: 0 %
Basophils Relative: 0 %
Eosinophils Absolute: 0 10*3/uL (ref 0.0–0.5)
Eosinophils Absolute: 0.1 10*3/uL (ref 0.0–0.5)
Eosinophils Relative: 0 %
Eosinophils Relative: 0 %
HCT: 42.2 % (ref 36.0–46.0)
HCT: 47 % — ABNORMAL HIGH (ref 36.0–46.0)
Hemoglobin: 14.3 g/dL (ref 12.0–15.0)
Hemoglobin: 16.5 g/dL — ABNORMAL HIGH (ref 12.0–15.0)
Immature Granulocytes: 1 %
Immature Granulocytes: 1 %
Lymphocytes Relative: 5 %
Lymphocytes Relative: 9 %
Lymphs Abs: 0.9 10*3/uL (ref 0.7–4.0)
Lymphs Abs: 1.1 10*3/uL (ref 0.7–4.0)
MCH: 31.2 pg (ref 26.0–34.0)
MCH: 31.9 pg (ref 26.0–34.0)
MCHC: 33.9 g/dL (ref 30.0–36.0)
MCHC: 35.1 g/dL (ref 30.0–36.0)
MCV: 90.7 fL (ref 80.0–100.0)
MCV: 92.1 fL (ref 80.0–100.0)
Monocytes Absolute: 0.5 10*3/uL (ref 0.1–1.0)
Monocytes Absolute: 0.6 10*3/uL (ref 0.1–1.0)
Monocytes Relative: 3 %
Monocytes Relative: 5 %
Neutro Abs: 11.1 10*3/uL — ABNORMAL HIGH (ref 1.7–7.7)
Neutro Abs: 15.4 10*3/uL — ABNORMAL HIGH (ref 1.7–7.7)
Neutrophils Relative %: 85 %
Neutrophils Relative %: 91 %
Platelets: 286 10*3/uL (ref 150–400)
Platelets: 335 10*3/uL (ref 150–400)
RBC: 4.58 MIL/uL (ref 3.87–5.11)
RBC: 5.18 MIL/uL — ABNORMAL HIGH (ref 3.87–5.11)
RDW: 13.5 % (ref 11.5–15.5)
RDW: 13.8 % (ref 11.5–15.5)
WBC: 13 10*3/uL — ABNORMAL HIGH (ref 4.0–10.5)
WBC: 16.9 10*3/uL — ABNORMAL HIGH (ref 4.0–10.5)
nRBC: 0 % (ref 0.0–0.2)
nRBC: 0 % (ref 0.0–0.2)

## 2020-10-01 LAB — LIPASE, BLOOD: Lipase: 32 U/L (ref 11–51)

## 2020-10-01 LAB — HCG, QUANTITATIVE, PREGNANCY: hCG, Beta Chain, Quant, S: <1 m[IU]/mL (ref <5)

## 2020-10-01 MED ORDER — ONDANSETRON 4 MG PO TBDP: 4.0000 mg | ORAL_TABLET | Freq: Three times a day (TID) | ORAL | 0 refills | Status: DC | PRN

## 2020-10-01 MED ORDER — IOHEXOL 9 MG/ML PO SOLN
500.0000 mL | ORAL | Status: AC
  Administered 2020-10-01: 500 mL via ORAL

## 2020-10-01 MED ORDER — SODIUM CHLORIDE 0.9 % IV SOLN
Freq: Once | INTRAVENOUS | Status: AC
  Filled 2020-10-01: qty 250

## 2020-10-01 MED ORDER — SODIUM CHLORIDE 0.9% FLUSH
10.0000 mL | Freq: Once | INTRAVENOUS | Status: AC
  Administered 2020-10-01: 10 mL via INTRAVENOUS
  Filled 2020-10-01: qty 10

## 2020-10-01 MED ORDER — ONDANSETRON HCL 4 MG/2ML IJ SOLN
8.0000 mg | Freq: Once | INTRAMUSCULAR | Status: AC
  Administered 2020-10-01: 8 mg via INTRAVENOUS
  Filled 2020-10-01: qty 4

## 2020-10-01 NOTE — Discharge Instructions (Signed)
1.  You may take Zofran as needed for nausea/vomiting. 2.  Clear liquids x12 hours, then bland diet x3 days, then slowly advance diet as tolerated. 3.  Return to the ER for worsening symptoms, persistent vomiting, difficulty breathing or other concerns.

## 2020-10-01 NOTE — ED Notes (Signed)
Patient transported to CT 

## 2020-10-01 NOTE — ED Provider Notes (Signed)
[facility] Emergency Department Provider Note   ____________________________________________   Event Date/Time   First MD Initiated Contact with Patient 09/30/20 2346     (approximate)  I have reviewed the triage vital signs and the nursing notes.   HISTORY  Chief Complaint Nausea, Emesis, and Diarrhea    HPI Alisha Vincent is a 30 y.o. female brought to the ED via EMS from home with a chief complaint of abdominal cramps, nausea, vomiting and diarrhea.  Young daughter also with similar symptoms.  Given 4 mg IV Zofran per EMS with some relief of symptoms.  Initial BP 80/50 with EMS.  Patient visibly anxious, tearful and hyperventilating.  Feels like she is having difficulty speaking.  Denies recent fever, chills, cough, chest pain, shortness of breath.  Scheduled to have CT scans done later this morning ordered by her oncologist as patient has a history of Hodgkin's lymphoma.     Past Medical History:  Diagnosis Date  . Encounter for fertility preservation procedure Nov 2012  . Hodgkin's disease, unspecified type, of lymph nodes of multiple sites     Patient Active Problem List   Diagnosis Date Noted  . Anemia in pregnancy 01/21/2018  . History of Hodgkin's lymphoma   . Supervision of high risk pregnancy, antepartum 08/16/2017  . Hodgkin's lymphoma ([place]) 02/07/2013    Past Surgical History:  Procedure Laterality Date  . LYMPH NODE BIOPSY  2012  . PORTA CATH REMOVAL N/A 07/19/2017   Procedure: PORTA CATH REMOVAL;  Surgeon: Algernon Huxley, MD;  Location: [place] [facility];  Service: Cardiovascular;  Laterality: N/A;  . PORTACATH PLACEMENT  Oct 2012  . wisdom teeth removal   AGE 60    Prior to Admission medications   Medication Sig Start Date End Date Taking? Authorizing Provider  ondansetron (ZOFRAN ODT) 4 MG disintegrating tablet Take 1 tablet (4 mg total) by mouth every 8 (eight) hours as needed for nausea or vomiting. 10/01/20  Yes Paulette Blanch, MD  acetaminophen (TYLENOL) 325 MG tablet Take 650 mg by mouth every 6 (six) hours as needed (for pain.).    [provider]  dexmethylphenidate (FOCALIN) 10 MG tablet Take 10 mg by mouth 2 (two) times daily.  03/22/17   [provider]  SUMAtriptan (IMITREX) 100 MG tablet Take 100 mg by mouth every 2 (two) hours as needed (for pain.).  02/02/14   [provider]    Allergies Tape  Family History  Problem Relation Age of Onset  . Cancer Paternal Grandfather     Social History Social History   Tobacco Use  . Smoking status: Never Smoker  . Smokeless tobacco: Never Used  Substance Use Topics  . Alcohol use: Yes  . Drug use: No    Review of Systems  Constitutional: No fever/chills Eyes: No visual changes. ENT: No sore throat. Cardiovascular: Denies chest pain. Respiratory: Denies shortness of breath. Gastrointestinal: No abdominal pain.  Positive for nausea, vomiting and diarrhea.  No constipation. Genitourinary: Negative for dysuria. Musculoskeletal: Negative for back pain. Skin: Negative for rash. Neurological: Negative for headaches, focal weakness or numbness.   ____________________________________________   PHYSICAL EXAM:  VITAL SIGNS: ED Triage Vitals [09/30/20 2351]  Enc Vitals Group     BP 121/90     Pulse Rate 99     Resp 19     Temp 98.2 F (36.8 C)     Temp Source Oral     SpO2 100 %  Weight 198 lb (89.8 kg)     Height 5\' 6"  (1.676 m)     Head Circumference      Peak Flow      Pain Score 0     Pain Loc      Pain Edu?      Excl. in [place]?     Constitutional: Alert and oriented.  Anxious appearing and in mild acute distress. Eyes: Conjunctivae are normal. PERRL. EOMI. Head: Atraumatic. Nose: No congestion/rhinnorhea. Mouth/Throat: Mucous membranes are mildly dry. Neck: No stridor.   Cardiovascular: Normal rate, regular rhythm. Grossly normal heart sounds.  Good peripheral circulation. Respiratory: Normal  respiratory effort.  No retractions. Lungs CTAB. Gastrointestinal: Soft and nontender to light or deep palpation. No distention. No abdominal bruits. No CVA tenderness. Musculoskeletal: No lower extremity tenderness nor edema.  No joint effusions. Neurologic: Alert and oriented x3.  CN II to XII grossly intact.  Staccato speech, normal language with tearful sobbing and hyperventilation. No gross focal neurologic deficits are appreciated.  Skin:  Skin is warm, dry and intact. No rash noted. Psychiatric: Mood and affect are anxious, hyperventilating. Speech and behavior are normal.  ____________________________________________   LABS (all labs ordered are listed, but only abnormal results are displayed)  Labs Reviewed  CBC WITH DIFFERENTIAL/PLATELET - Abnormal; Notable for the following components:      Result Value   WBC 16.9 (*)    RBC 5.18 (*)    Hemoglobin 16.5 (*)    HCT 47.0 (*)    Neutro Abs 15.4 (*)    Abs Immature Granulocytes 0.10 (*)    All other components within normal limits  COMPREHENSIVE METABOLIC PANEL - Abnormal; Notable for the following components:   CO2 17 (*)    Glucose, Bld 147 (*)    BUN 22 (*)    Creatinine, Ser 1.28 (*)    Calcium 10.4 (*)    Total Protein 9.0 (*)    Total Bilirubin 2.0 (*)    GFR, Estimated 57 (*)    Anion gap 16 (*)    All other components within normal limits  LIPASE, BLOOD  HCG, QUANTITATIVE, PREGNANCY  URINALYSIS, COMPLETE (UACMP) WITH MICROSCOPIC   ____________________________________________  EKG  ED ECG REPORT I, Marsean Elkhatib J, the attending physician, personally viewed and interpreted this ECG.   Date: 10/01/2020  EKG Time: 2348  Rate: 102  Rhythm: sinus tachycardia  Axis: Normal  Intervals:none  ST&T Change: Nonspecific  ____________________________________________  RADIOLOGY I, Deep Bonawitz J, personally viewed and evaluated these images (plain radiographs) as part of my medical decision making, as well as reviewing  the written report by the radiologist.  ED MD interpretation: No ICH, no SBO, liquid stool throughout colon consistent with diarrheal illness  Official radiology report(s): CT Abdomen Pelvis Wo Contrast  Result Date: 10/01/2020 CLINICAL DATA:  Nausea and vomiting. EXAM: CT ABDOMEN AND PELVIS WITHOUT CONTRAST TECHNIQUE: Multidetector CT imaging of the abdomen and pelvis was performed following the standard protocol without IV contrast. COMPARISON:  October 01, 2019 FINDINGS: Lower chest: The lung bases are clear. The heart size is normal. Hepatobiliary: The liver is normal. Normal gallbladder.There is no biliary ductal dilation. Pancreas: Normal contours without ductal dilatation. No peripancreatic fluid collection. Spleen: Unremarkable. Adrenals/Urinary Tract: --Adrenal glands: Unremarkable. --Right kidney/ureter: There is a punctate nonobstructing stone in the lower pole the right kidney. --Left kidney/ureter: There is a nonobstructing stone in the lower pole the left kidney measuring approximately 5 mm --Urinary bladder: Unremarkable. Stomach/Bowel: --Stomach/Duodenum: The stomach is  significantly distended with an associated air-fluid level. There is no clear obstructing lesion. --Small bowel: Unremarkable. --Colon: Liquid stool is noted throughout the colon. --Appendix: Not visualized. No right lower quadrant inflammation or free fluid. Vascular/Lymphatic: Normal course and caliber of the major abdominal vessels. --No retroperitoneal lymphadenopathy. --No mesenteric lymphadenopathy. --No pelvic or inguinal lymphadenopathy. Reproductive: Unremarkable Other: No ascites or free air. The abdominal wall is normal. Musculoskeletal. No acute displaced fractures. IMPRESSION: 1. The stomach is significantly distended with an associated air-fluid level. There is no clear obstructing lesion. 2. Liquid stool noted throughout the colon consistent with a diarrheal illness. 3. Bilateral nonobstructing nephrolithiasis.  Electronically Signed   By: Constance Holster M.D.   On: 10/01/2020 02:23   CT Head Wo Contrast  Result Date: 10/01/2020 CLINICAL DATA:  Mental status change with an unknown cause. EXAM: CT HEAD WITHOUT CONTRAST TECHNIQUE: Contiguous axial images were obtained from the base of the skull through the vertex without intravenous contrast. COMPARISON:  None. FINDINGS: Brain: No hemorrhage. No extraaxial collection.No midline shift. The ventricular system is unremarkable.The basal cisterns are unremarkable. The grey white differentiation is unremarkable. There appears to be artifact coursing through the patient's pons. The cerebellum is unremarkable. The sella is unremarkable. Vascular: No hyperdense vessel or unexpected calcification. Skull: The calvarium is unremarkable. The skull base is unremarkable. The visualized upper cervical spine is unremarkable. Sinuses/Orbits: The visualized orbits are unremarkable. The paranasal sinuses are unremarkable. The mastoid air cells are clear. Other: The visualized parotid gland is unremarkable. There is no scalp soft tissue swelling. IMPRESSION: No acute intracranial abnormality. Electronically Signed   By: Constance Holster M.D.   On: 10/01/2020 02:31    ____________________________________________   PROCEDURES  Procedure(s) performed (including Critical Care):  .1-3 Lead EKG Interpretation Performed by: Paulette Blanch, MD Authorized by: Paulette Blanch, MD     Interpretation: normal     ECG rate:  92   ECG rate assessment: normal     Rhythm: sinus rhythm     Ectopy: none     Conduction: normal   Comments:     Patient placed on cardiac monitor to evaluate for arrhythmias     ____________________________________________   INITIAL IMPRESSION / ASSESSMENT AND PLAN / ED COURSE  As part of my medical decision making, I reviewed the following data within the electronic MEDICAL RECORD NUMBER History obtained from family, Nursing notes reviewed and incorporated,  Labs reviewed, EKG interpreted, Old chart reviewed, Radiograph reviewed and Notes from prior ED visits     30 year old female presenting with nausea/vomiting/diarrhea. Differential diagnosis includes, but is not limited to, ovarian cyst, ovarian torsion, acute appendicitis, diverticulitis, urinary tract infection/pyelonephritis, endometriosis, bowel obstruction, colitis, renal colic, gastroenteritis, hernia, fibroids, endometriosis, pregnancy related pain including ectopic pregnancy, etc.  We will obtain basic lab work, UA.  No focal neurological deficits noted on examination.  Speech is rapid and broken as patient is tearful, sobbing and hyperventilating.  Speech improved on patient slowing down her breathing.  I personally reviewed patient's chart and see that she is ordered for several CT scans in the morning.  Will obtain CT head, abdomen/pelvis tonight to evaluate patient's concern of speech difficulty and N/V/D.  Initiate IV fluid resuscitation, IV Zofran for nausea.  Clinical Course as of 10/01/20 0257  Fri Oct 01, 2020  0110 Spouse at bedside.  Patient looks much better, has stopped hyperventilating.  Speaking with clear speech with no focal neurological deficits.  Asking for ice chips. [JS]  1962 Updated  patient and spouse on CT imaging results.  She is feeling significantly better, tolerated oral contrast without emesis.  She is smiling and eager for discharge home.  Denies urinary symptoms so we will cancel UA.  Will discharge home with prescription for as needed Zofran and patient will follow up with her PCP closely.  Strict return precautions given.  Both verbalized understanding agree with plan of care. [JS]    Clinical Course User Index [JS] Paulette Blanch, MD     ____________________________________________   FINAL CLINICAL IMPRESSION(S) / ED DIAGNOSES  Final diagnoses:  Nausea vomiting and diarrhea  Dehydration     ED Discharge Orders         Ordered    ondansetron  (ZOFRAN ODT) 4 MG disintegrating tablet  Every 8 hours PRN        10/01/20 0256          *Please note:  QUIANNA AVERY was evaluated in Emergency Department on 10/01/2020 for the symptoms described in the history of present illness. She was evaluated in the context of the global COVID-19 pandemic, which necessitated consideration that the patient might be at risk for infection with the SARS-CoV-2 virus that causes COVID-19. Institutional protocols and algorithms that pertain to the evaluation of patients at risk for COVID-19 are in a state of rapid change based on information released by regulatory bodies including the CDC and federal and state organizations. These policies and algorithms were followed during the patient's care in the ED.  Some ED evaluations and interventions may be delayed as a result of limited staffing during and the pandemic.*   Note:  This document was prepared using Dragon voice recognition software and may include unintentional dictation errors.   Paulette Blanch, MD 10/01/20 [phone]

## 2020-10-01 NOTE — Progress Notes (Signed)
Patient tolerated IV fluids infusion well today, Zofran given, no concerns voiced. Patient discharged.   Scheduler will call with CT scan apt time, patient aware and verbalizes understanding. Stable.

## 2020-10-01 NOTE — ED Notes (Addendum)
Pt asked to provide urine sample, advised unable at this time. Pt reminded to keep arm start for IV fluids.

## 2020-10-01 NOTE — ED Notes (Signed)
CT made aware pt has drank as much contrast as she can tolerate per MD Beather Arbour.

## 2020-10-02 NOTE — Progress Notes (Deleted)
[place]  [phone] [phone]  ID: Alisha Vincent OB: 1990-09-03  MR#: [id number]  AOZ#:[id number]  Patient Care Team: Rusty Aus, MD as PCP - General (Unknown Physician Specialty) Bary Castilla [place] Gleason, MD as Consulting Physician (General Surgery) Lloyd Huger, MD as Consulting Physician (Oncology)    I connected with Alisha Vincent on 10/02/20 at  2:45 PM EST by {Blank single:19197::"video enabled telemedicine visit","telephone visit"} and verified that I am speaking with the correct person using two identifiers.   I discussed the limitations, risks, security and privacy concerns of performing an evaluation and management service by telemedicine and the availability of in-person appointments. I also discussed with the patient that there may be a patient responsible charge related to this service. The patient expressed understanding and agreed to proceed.   Other persons participating in the visit and their role in the encounter: Patient, MD.  Patient's location: Home. Provider's location: Clinic.   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 Now in remission.  History of iron deficiency anemia in pregnancy.  INTERVAL HISTORY: Patient agreed to video enabled telemedicine visit for further evaluation and discussion of her imaging results.  Appointment was concluded over the telephone secondary to technical difficulties.  She continues to feel well and remains asymptomatic.  She is now back to work full-time.  She has no neurologic complaints. She denies any fevers, night sweats, or weight loss. She denies any pain.  She denies any chest pain, shortness of breath, cough, or hemoptysis.  She denies any nausea, vomiting, constipation, or diarrhea. She has no urinary complaints.  Patient offers no specific complaints today.  REVIEW OF SYSTEMS:   Review of Systems  Constitutional:  Negative for fever, malaise/fatigue and weight loss.  HENT: Negative.   Respiratory: Negative.  Negative for cough and shortness of breath.   Cardiovascular: Negative.  Negative for chest pain and leg swelling.  Gastrointestinal: Negative.  Negative for abdominal pain.  Genitourinary: Negative.   Musculoskeletal: Negative.   Skin: Negative.  Negative for rash.  Neurological: Negative.  Negative for weakness.  Endo/Heme/Allergies: Does not bruise/bleed easily.  Psychiatric/Behavioral: Negative.  The patient is not nervous/anxious.     As per HPI. Otherwise, a complete review of systems is negative.  PAST MEDICAL HISTORY: Past Medical History:  Diagnosis Date  . Encounter for fertility preservation procedure Nov 2012  . Hodgkin's disease, unspecified type, of lymph nodes of multiple sites     PAST SURGICAL HISTORY: Past Surgical History:  Procedure Laterality Date  . LYMPH NODE BIOPSY  2012  . PORTA CATH REMOVAL N/A 07/19/2017   Procedure: PORTA CATH REMOVAL;  Surgeon: Algernon Huxley, MD;  Location: [place] [facility];  Service: Cardiovascular;  Laterality: N/A;  . PORTACATH PLACEMENT  Oct 2012  . wisdom teeth removal   AGE 8    FAMILY HISTORY: Reviewed and unchanged. No reported history of malignancy or chronic disease.     ADVANCED DIRECTIVES:    HEALTH MAINTENANCE: Social History   Tobacco Use  . Smoking status: Never Smoker  . Smokeless tobacco: Never Used  Substance Use Topics  . Alcohol use: Yes  . Drug use: No     Colonoscopy:  PAP:  Bone density:  Lipid panel:  Allergies  Allergen Reactions  . Tape Rash    Current Outpatient Medications  Medication Sig Dispense Refill  . acetaminophen (TYLENOL) 325 MG tablet Take 650 mg by mouth every  6 (six) hours as needed (for pain.).    Marland Kitchen dexmethylphenidate (FOCALIN) 10 MG tablet Take 10 mg by mouth 2 (two) times daily.     . ondansetron (ZOFRAN ODT) 4 MG disintegrating tablet Take 1 tablet (4 mg total) by  mouth every 8 (eight) hours as needed for nausea or vomiting. 20 tablet 0  . SUMAtriptan (IMITREX) 100 MG tablet Take 100 mg by mouth every 2 (two) hours as needed (for pain.).      No current facility-administered medications for this visit.   Facility-Administered Medications Ordered in Other Visits  Medication Dose Route Frequency Provider Last Rate Last Admin  . sodium chloride flush (NS) 0.9 % injection 10 mL  10 mL Intravenous PRN Lloyd Huger, MD   10 mL at 03/14/17 1044    OBJECTIVE: There were no vitals filed for this visit.   There is no height or weight on file to calculate BMI.    ECOG FS:0 - Asymptomatic  General: Well-developed, well-nourished, no acute distress. HEENT: Normocephalic. Neuro: Alert, answering all questions appropriately. Cranial nerves grossly intact. Psych: Normal affect.  LAB RESULTS:  Lab Results  Component Value Date   NA 134 (L) 10/01/2020   K 3.7 10/01/2020   CL 102 10/01/2020   CO2 19 (L) 10/01/2020   GLUCOSE 94 10/01/2020   BUN 24 (H) 10/01/2020   CREATININE 1.22 (H) 10/01/2020   CALCIUM 8.4 (L) 10/01/2020   PROT 7.6 10/01/2020   ALBUMIN 4.2 10/01/2020   AST 23 10/01/2020   ALT 16 10/01/2020   ALKPHOS 67 10/01/2020   BILITOT 1.9 (H) 10/01/2020   GFRNONAA >60 10/01/2020   GFRAA >60 05/15/2018    Lab Results  Component Value Date   WBC 13.0 (H) 10/01/2020   NEUTROABS 11.1 (H) 10/01/2020   HGB 14.3 10/01/2020   HCT 42.2 10/01/2020   MCV 92.1 10/01/2020   PLT 286 10/01/2020   Lab Results  Component Value Date   IRON 111 10/01/2019   TIBC 385 10/01/2019   IRONPCTSAT 29 10/01/2019   Lab Results  Component Value Date   FERRITIN 46 10/01/2019     STUDIES: CT Abdomen Pelvis Wo Contrast  Result Date: 10/01/2020 CLINICAL DATA:  Nausea and vomiting. EXAM: CT ABDOMEN AND PELVIS WITHOUT CONTRAST TECHNIQUE: Multidetector CT imaging of the abdomen and pelvis was performed following the standard protocol without IV contrast.  COMPARISON:  October 01, 2019 FINDINGS: Lower chest: The lung bases are clear. The heart size is normal. Hepatobiliary: The liver is normal. Normal gallbladder.There is no biliary ductal dilation. Pancreas: Normal contours without ductal dilatation. No peripancreatic fluid collection. Spleen: Unremarkable. Adrenals/Urinary Tract: --Adrenal glands: Unremarkable. --Right kidney/ureter: There is a punctate nonobstructing stone in the lower pole the right kidney. --Left kidney/ureter: There is a nonobstructing stone in the lower pole the left kidney measuring approximately 5 mm --Urinary bladder: Unremarkable. Stomach/Bowel: --Stomach/Duodenum: The stomach is significantly distended with an associated air-fluid level. There is no clear obstructing lesion. --Small bowel: Unremarkable. --Colon: Liquid stool is noted throughout the colon. --Appendix: Not visualized. No right lower quadrant inflammation or free fluid. Vascular/Lymphatic: Normal course and caliber of the major abdominal vessels. --No retroperitoneal lymphadenopathy. --No mesenteric lymphadenopathy. --No pelvic or inguinal lymphadenopathy. Reproductive: Unremarkable Other: No ascites or free air. The abdominal wall is normal. Musculoskeletal. No acute displaced fractures. IMPRESSION: 1. The stomach is significantly distended with an associated air-fluid level. There is no clear obstructing lesion. 2. Liquid stool noted throughout the colon consistent with a diarrheal  illness. 3. Bilateral nonobstructing nephrolithiasis. Electronically Signed   By: Constance Holster M.D.   On: 10/01/2020 02:23   CT Head Wo Contrast  Result Date: 10/01/2020 CLINICAL DATA:  Mental status change with an unknown cause. EXAM: CT HEAD WITHOUT CONTRAST TECHNIQUE: Contiguous axial images were obtained from the base of the skull through the vertex without intravenous contrast. COMPARISON:  None. FINDINGS: Brain: No hemorrhage. No extraaxial collection.No midline shift. The ventricular  system is unremarkable.The basal cisterns are unremarkable. The grey white differentiation is unremarkable. There appears to be artifact coursing through the patient's pons. The cerebellum is unremarkable. The sella is unremarkable. Vascular: No hyperdense vessel or unexpected calcification. Skull: The calvarium is unremarkable. The skull base is unremarkable. The visualized upper cervical spine is unremarkable. Sinuses/Orbits: The visualized orbits are unremarkable. The paranasal sinuses are unremarkable. The mastoid air cells are clear. Other: The visualized parotid gland is unremarkable. There is no scalp soft tissue swelling. IMPRESSION: No acute intracranial abnormality. Electronically Signed   By: Constance Holster M.D.   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 Now in remission.   PLAN:   1.  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  Her most recent imaging on October 01, 2019 reviewed independently and reported as above continues to report complete remission without evidence of recurrent or progressive disease. Previously, after lengthy discussion, patient did not receive maintenance brentuximab.  No intervention is needed at this time.  Return to clinic in 1 year with repeat imaging and further evaluation.  At this point patient will be nearly 6 years removed from her transplant and possibly could be discharged from clinic. 2.  Iron deficiency anemia: Patient found to have significantly decreased hemoglobin and iron stores during pregnancy.  Hemoglobin at outside clinic was reported as normal limits.  Iron panel also within normal limits.  No further intervention is needed at this time.   3.  Pregnancy: Patient gave birth to a healthy baby boy, Sabra Heck, in December 2020.   I  provided *** minutes of {Blank single:19197::"face-to-face video visit time","non face-to-face telephone visit time"} during this encounter which included chart review, counseling, and coordination of care as documented above.  Transplant physician: Dr. Arnell Sieving- Office: [phone], Cell: [phone]. OB/GYN physician: Dr. Jeneen Rinks with [facility].  Patient expressed understanding and was in agreement with this plan. She also understands that She can call clinic at any time with any questions, concerns, or complaints.    Lloyd Huger, MD   10/02/2020 9:05 AM

## 2020-10-04 ENCOUNTER — Other Ambulatory Visit: Payer: Self-pay | Admitting: *Deleted

## 2020-10-04 DIAGNOSIS — C8118 Nodular sclerosis classical Hodgkin lymphoma, lymph nodes of multiple sites: Secondary | ICD-10-CM

## 2020-10-05 ENCOUNTER — Inpatient Hospital Stay: Payer: BC Managed Care – PPO | Admitting: Oncology

## 2020-10-05 DIAGNOSIS — C8118 Nodular sclerosis classical Hodgkin lymphoma, lymph nodes of multiple sites: Secondary | ICD-10-CM

## 2020-10-15 NOTE — Progress Notes (Signed)
[place]  Telephone:(336) [phone] Fax:(336) [phone]  ID: Alisha Vincent OB: [birth year]  MR#: [id number]  AOZ#:[id number]  Patient Care Team: Rusty Aus, MD as PCP - General (Unknown Physician Specialty) Bary Castilla Forest Gleason, MD as Consulting Physician (General Surgery) Lloyd Huger, MD as Consulting Physician (Oncology)    I connected with Alisha Vincent on 10/21/20 at  3:00 PM EDT by video enabled telemedicine visit and verified that I am speaking with the correct person using two identifiers.   I discussed the limitations, risks, security and privacy concerns of performing an evaluation and management service by telemedicine and the availability of in-person appointments. I also discussed with the patient that there may be a patient responsible charge related to this service. The patient expressed understanding and agreed to proceed.   Other persons participating in the visit and their role in the encounter: Patient, MD.  Patient's location: Home. Provider's location: Clinic.   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 Now in remission.  History of iron deficiency anemia in pregnancy.  INTERVAL HISTORY: Patient agreed to video enabled telemedicine visit for routine evaluation and discussion of her imaging results.  She recently had GI enteritis with significant diarrhea and dehydration requiring multiple units of IV fluids.  She has now recovered and back to her baseline.  She continues to be active and work full-time.  She has no neurologic complaints. She denies any fevers, night sweats, or weight loss. She denies any pain.  She denies any chest pain, shortness of breath, cough, or hemoptysis.  She denies any nausea, vomiting, constipation, or diarrhea. She has no urinary complaints.  Patient feels at her baseline offers no specific complaints today.  REVIEW OF SYSTEMS:   Review of Systems   Constitutional: Negative for fever, malaise/fatigue and weight loss.  HENT: Negative.   Respiratory: Negative.  Negative for cough and shortness of breath.   Cardiovascular: Negative.  Negative for chest pain and leg swelling.  Gastrointestinal: Negative.  Negative for abdominal pain.  Genitourinary: Negative.   Musculoskeletal: Negative.   Skin: Negative.  Negative for rash.  Neurological: Negative.  Negative for weakness.  Endo/Heme/Allergies: Does not bruise/bleed easily.  Psychiatric/Behavioral: Negative.  The patient is not nervous/anxious.     As per HPI. Otherwise, a complete review of systems is negative.  PAST MEDICAL HISTORY: Past Medical History:  Diagnosis Date  . Encounter for fertility preservation procedure Nov 2012  . Hodgkin's disease, unspecified type, of lymph nodes of multiple sites     PAST SURGICAL HISTORY: Past Surgical History:  Procedure Laterality Date  . LYMPH NODE BIOPSY  2012  . PORTA CATH REMOVAL N/A 07/19/2017   Procedure: PORTA CATH REMOVAL;  Surgeon: Algernon Huxley, MD;  Location: [facility];  Service: Cardiovascular;  Laterality: N/A;  . PORTACATH PLACEMENT  Oct 2012  . wisdom teeth removal   AGE 30    FAMILY HISTORY: Reviewed and unchanged. No reported history of malignancy or chronic disease.     ADVANCED DIRECTIVES:    HEALTH MAINTENANCE: Social History   Tobacco Use  . Smoking status: Never Smoker  . Smokeless tobacco: Never Used  Substance Use Topics  . Alcohol use: Yes  . Drug use: No     Colonoscopy:  PAP:  Bone density:  Lipid panel:  Allergies  Allergen Reactions  . Tape Rash    Current Outpatient Medications  Medication Sig Dispense Refill  . acetaminophen (  TYLENOL) 325 MG tablet Take 650 mg by mouth every 6 (six) hours as needed (for pain.).    Marland Kitchen dexmethylphenidate (FOCALIN) 10 MG tablet Take 10 mg by mouth 2 (two) times daily.     . ondansetron (ZOFRAN ODT) 4 MG disintegrating tablet Take 1 tablet  (4 mg total) by mouth every 8 (eight) hours as needed for nausea or vomiting. 20 tablet 0  . SUMAtriptan (IMITREX) 100 MG tablet Take 100 mg by mouth every 2 (two) hours as needed (for pain.).      No current facility-administered medications for this visit.   Facility-Administered Medications Ordered in Other Visits  Medication Dose Route Frequency Provider Last Rate Last Admin  . sodium chloride flush (NS) 0.9 % injection 10 mL  10 mL Intravenous PRN Lloyd Huger, MD   10 mL at 03/14/17 1044    OBJECTIVE: There were no vitals filed for this visit.   There is no height or weight on file to calculate BMI.    ECOG FS:0 - Asymptomatic  General: Well-developed, well-nourished, no acute distress. HEENT: Normocephalic. Neuro: Alert, answering all questions appropriately. Cranial nerves grossly intact. Psych: Normal affect.  LAB RESULTS:  Lab Results  Component Value Date   NA 134 (L) 10/01/2020   K 3.7 10/01/2020   CL 102 10/01/2020   CO2 19 (L) 10/01/2020   GLUCOSE 94 10/01/2020   BUN 24 (H) 10/01/2020   CREATININE 1.22 (H) 10/01/2020   CALCIUM 8.4 (L) 10/01/2020   PROT 7.6 10/01/2020   ALBUMIN 4.2 10/01/2020   AST 23 10/01/2020   ALT 16 10/01/2020   ALKPHOS 67 10/01/2020   BILITOT 1.9 (H) 10/01/2020   GFRNONAA >60 10/01/2020   GFRAA >60 05/15/2018    Lab Results  Component Value Date   WBC 13.0 (H) 10/01/2020   NEUTROABS 11.1 (H) 10/01/2020   HGB 14.3 10/01/2020   HCT 42.2 10/01/2020   MCV 92.1 10/01/2020   PLT 286 10/01/2020   Lab Results  Component Value Date   IRON 111 10/01/2019   TIBC 385 10/01/2019   IRONPCTSAT 29 10/01/2019   Lab Results  Component Value Date   FERRITIN 46 10/01/2019     STUDIES: CT Abdomen Pelvis Wo Contrast  Result Date: 10/01/2020 CLINICAL DATA:  Nausea and vomiting. EXAM: CT ABDOMEN AND PELVIS WITHOUT CONTRAST TECHNIQUE: Multidetector CT imaging of the abdomen and pelvis was performed following the standard protocol  without IV contrast. COMPARISON:  October 01, 2019 FINDINGS: Lower chest: The lung bases are clear. The heart size is normal. Hepatobiliary: The liver is normal. Normal gallbladder.There is no biliary ductal dilation. Pancreas: Normal contours without ductal dilatation. No peripancreatic fluid collection. Spleen: Unremarkable. Adrenals/Urinary Tract: --Adrenal glands: Unremarkable. --Right kidney/ureter: There is a punctate nonobstructing stone in the lower pole the right kidney. --Left kidney/ureter: There is a nonobstructing stone in the lower pole the left kidney measuring approximately 5 mm --Urinary bladder: Unremarkable. Stomach/Bowel: --Stomach/Duodenum: The stomach is significantly distended with an associated air-fluid level. There is no clear obstructing lesion. --Small bowel: Unremarkable. --Colon: Liquid stool is noted throughout the colon. --Appendix: Not visualized. No right lower quadrant inflammation or free fluid. Vascular/Lymphatic: Normal course and caliber of the major abdominal vessels. --No retroperitoneal lymphadenopathy. --No mesenteric lymphadenopathy. --No pelvic or inguinal lymphadenopathy. Reproductive: Unremarkable Other: No ascites or free air. The abdominal wall is normal. Musculoskeletal. No acute displaced fractures. IMPRESSION: 1. The stomach is significantly distended with an associated air-fluid level. There is no clear obstructing lesion. 2.  Liquid stool noted throughout the colon consistent with a diarrheal illness. 3. Bilateral nonobstructing nephrolithiasis. Electronically Signed   By: Constance Holster M.D.   On: 10/01/2020 02:23   CT Head Wo Contrast  Result Date: 10/01/2020 CLINICAL DATA:  Mental status change with an unknown cause. EXAM: CT HEAD WITHOUT CONTRAST TECHNIQUE: Contiguous axial images were obtained from the base of the skull through the vertex without intravenous contrast. COMPARISON:  None. FINDINGS: Brain: No hemorrhage. No extraaxial collection.No midline  shift. The ventricular system is unremarkable.The basal cisterns are unremarkable. The grey white differentiation is unremarkable. There appears to be artifact coursing through the patient's pons. The cerebellum is unremarkable. The sella is unremarkable. Vascular: No hyperdense vessel or unexpected calcification. Skull: The calvarium is unremarkable. The skull base is unremarkable. The visualized upper cervical spine is unremarkable. Sinuses/Orbits: The visualized orbits are unremarkable. The paranasal sinuses are unremarkable. The mastoid air cells are clear. Other: The visualized parotid gland is unremarkable. There is no scalp soft tissue swelling. IMPRESSION: No acute intracranial abnormality. Electronically Signed   By: Constance Holster M.D.   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 Hematologic malignancy, surveillance; follow-up lymphoma. EXAM: CT NECK WITH CONTRAST TECHNIQUE: Multidetector CT imaging of the neck was performed using the standard protocol following the bolus administration of intravenous contrast. CONTRAST:  149mL OMNIPAQUE IOHEXOL 300 MG/ML  SOLN COMPARISON:  Prior neck CT examinations 10/01/2019 and earlier FINDINGS: Pharynx and larynx: No appreciable swelling or discrete mass within the oral cavity, pharynx or larynx. Salivary glands: No inflammation, mass, or stone. Thyroid: Unremarkable. Lymph nodes: No enlarged or suspicious lymph nodes within the neck. Vascular: The major vascular structures of the neck are patent. Limited intracranial: No evidence of acute intracranial abnormality at the imaged levels. Visualized orbits: No mass or acute finding. Mastoids and visualized paranasal sinuses: Left larger than right maxillary sinus mucous retention cysts, unchanged. No significant mastoid effusion. Skeleton: No acute bony abnormality or aggressive osseous  lesion. Upper chest: Reported separately. IMPRESSION: No evidence of recurrent lymphoma within the neck. Unchanged mucous retention cysts within the bilateral maxillary sinuses. Otherwise unremarkable exam. Electronically Signed   By: Kellie Simmering DO   On: 10/20/2020 18:12   CT CHEST ABDOMEN PELVIS W CONTRAST  Result Date: 10/20/2020 CLINICAL DATA:  Restaging Hodgkin's lymphoma. EXAM: CT CHEST, ABDOMEN, AND PELVIS WITH CONTRAST TECHNIQUE: Multidetector CT imaging of the chest, abdomen and pelvis was performed following the standard protocol during bolus administration of intravenous contrast. CONTRAST:  131mL OMNIPAQUE IOHEXOL 300 MG/ML  SOLN COMPARISON:  Multiple priors including CT chest abdomen pelvis October 01, 2019 and CT abdomen pelvis October 01, 2020 FINDINGS: CT CHEST FINDINGS Cardiovascular: No thoracic aortic aneurysm. No central pulmonary embolus. Normal size heart. No significant pericardial effusion/thickening. Mediastinum/Nodes: Thyroid is within normal limits without discrete nodularity. No pathologically enlarged mediastinal, hilar or axillary lymph nodes. The trachea and esophagus are grossly unremarkable. Lungs/Pleura: No suspicious pulmonary nodules or masses. No pleural effusion. No pneumothorax. Musculoskeletal: No chest wall mass or suspicious bone lesions identified. CT ABDOMEN PELVIS FINDINGS Hepatobiliary: No suspicious hepatic lesions. No gallstones, gallbladder wall thickening, or biliary dilatation. Pancreas: Unremarkable. No pancreatic ductal dilatation or surrounding inflammatory changes. Spleen: Normal in size without focal abnormality. Adrenals/Urinary Tract: Adrenal glands are unremarkable. No hydronephrosis. Bilateral nonobstructive renal calculi measuring up to 4 mm on the left and 1-2 mm on  the right. No solid enhancing renal lesions. Bladder is unremarkable. Stomach/Bowel: Radiopaque enteric contrast traverses the: Stomach is within normal limits. Appendix appears normal.  Scattered sigmoid diverticula without findings of acute diverticulitis. Moderate volume of formed stool throughout the colon. No evidence of bowel wall thickening, distention, or inflammatory changes. Vascular/Lymphatic: No pathologically enlarged abdominal or pelvic lymph nodes. No abdominal aortic aneurysm. Reproductive: Uterus and bilateral adnexa are unremarkable. Nabothian cysts. Other: No abdominopelvic ascites. Musculoskeletal: No acute or significant osseous findings. IMPRESSION: 1. No lymphadenopathy within the chest, abdomen, or pelvis to suggest disease recurrence. 2. Bilateral nonobstructive renal calculi. 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 Now in remission.   PLAN:   1.  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  Imaging results from October 20, 2020 reviewed independently and report as above with no obvious evidence of recurrent or progressive disease.  Patient remains in complete remission.  Patient is now 6 years removed from her transplant and can be discharged from clinic.  No further intervention is needed.  Please refer patient back if there are any questions or concerns.   2.  Iron deficiency anemia: Patient continues to have a mildly decreased hemoglobin, but iron stores are within normal limits.  No further intervention is needed at this time.  3.  Pregnancy: Patient gave birth to a healthy baby boy, Sabra Heck, in December 2020.   I provided 20 minutes of face-to-face video visit time during this encounter which included chart review, counseling, and coordination of care as documented above.  Transplant physician: Dr. Arnell Sieving- Office: [phone], Cell: [phone]. OB/GYN physician: Dr. Jeneen Rinks with [facility].  Patient expressed understanding and was in agreement with this plan. She also understands that She can call clinic at any time with any questions, concerns, or complaints.    Lloyd Huger, MD   10/21/2020 6:30 PM

## 2020-10-20 ENCOUNTER — Other Ambulatory Visit: Payer: Self-pay

## 2020-10-20 ENCOUNTER — Ambulatory Visit
Admission: RE | Admit: 2020-10-20 | Discharge: 2020-10-20 | Disposition: A | Discharge status: Home / Self Care | Payer: BC Managed Care – PPO | Source: Ambulatory Visit | Attending: Oncology | Admitting: Oncology

## 2020-10-20 DIAGNOSIS — C8118 Nodular sclerosis classical Hodgkin lymphoma, lymph nodes of multiple sites: Secondary | ICD-10-CM | POA: Diagnosis not present

## 2020-10-20 MED ORDER — IOHEXOL 300 MG/ML  SOLN
100.0000 mL | Freq: Once | INTRAMUSCULAR | Status: AC | PRN
  Administered 2020-10-20: 100 mL via INTRAVENOUS

## 2020-10-21 ENCOUNTER — Inpatient Hospital Stay (HOSPITAL_BASED_OUTPATIENT_CLINIC_OR_DEPARTMENT_OTHER): Payer: BC Managed Care – PPO | Admitting: Oncology

## 2020-10-21 ENCOUNTER — Encounter: Payer: Self-pay | Admitting: Oncology

## 2020-10-21 DIAGNOSIS — C8118 Nodular sclerosis classical Hodgkin lymphoma, lymph nodes of multiple sites: Secondary | ICD-10-CM | POA: Diagnosis not present

## 2022-02-09 IMAGING — CT CT HEAD W/O CM
3 series · 16 of 47 positions shown, 19 images · non-contrast
Comparison: None.

CLINICAL DATA: Mental status change with an unknown cause.

EXAM:
CT HEAD WITHOUT CONTRAST
TECHNIQUE: Contiguous axial images were obtained from the base of the skull
through the vertex without intravenous contrast.

[Series 2: head wo · axial · 0.43mm/px · z∈[-161,-36]mm · 10 of 31 slices shown, 13 images]
[im 3/31  brain]
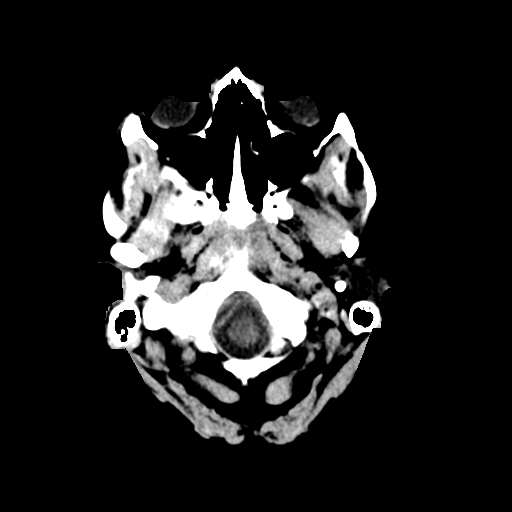
[im 3/31  bone]
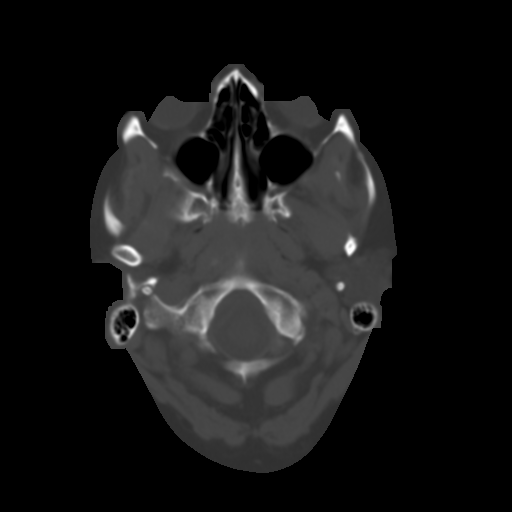
[im 6/31  brain]
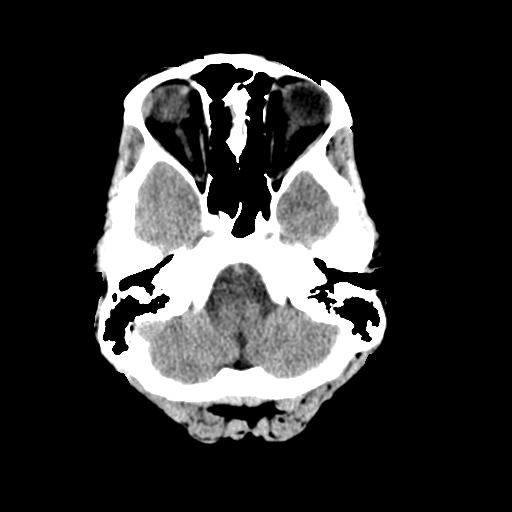
[im 9/31  brain]
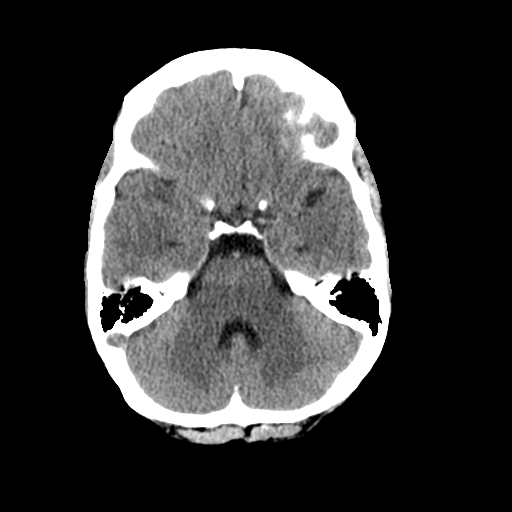
[im 11/31  brain]
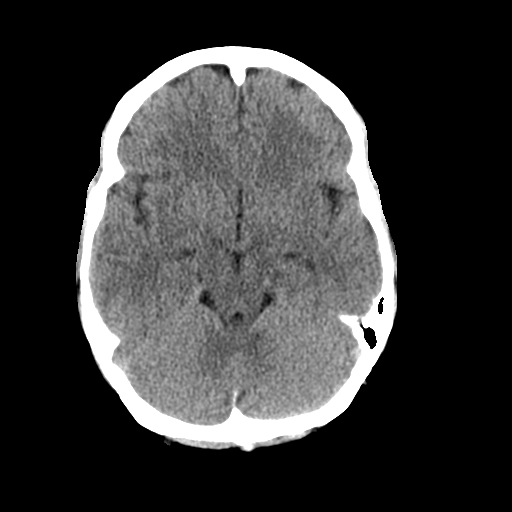
[im 14/31  brain]
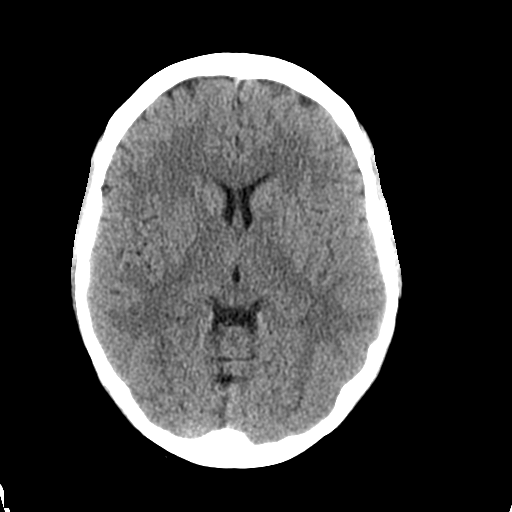
[im 14/31  bone]
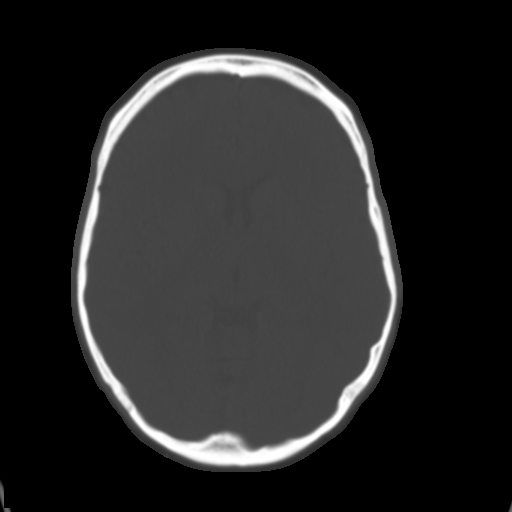
[im 17/31  brain]
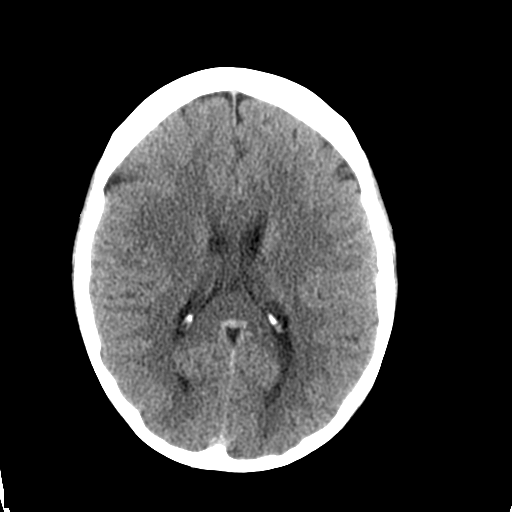
[im 20/31  brain]
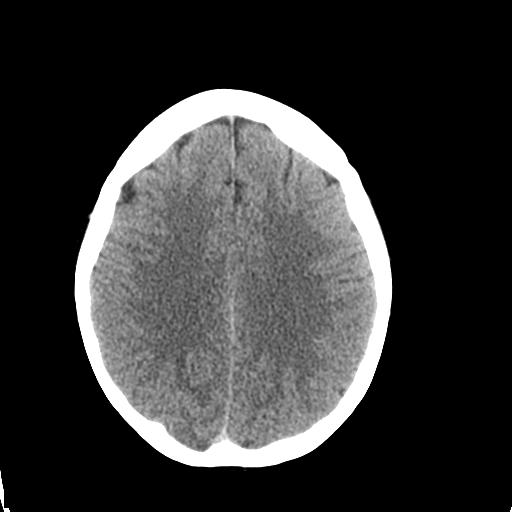
[im 23/31  brain]
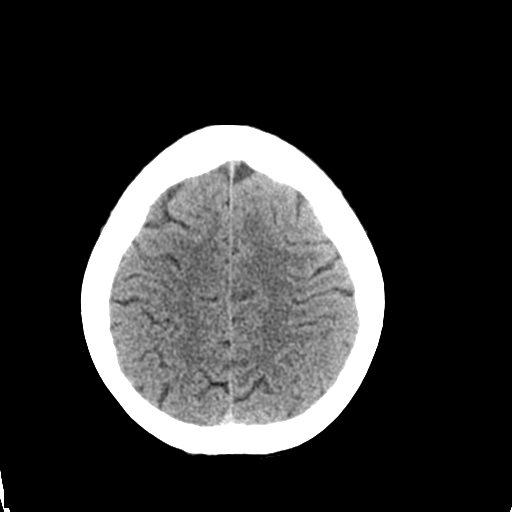
[im 25/31  brain]
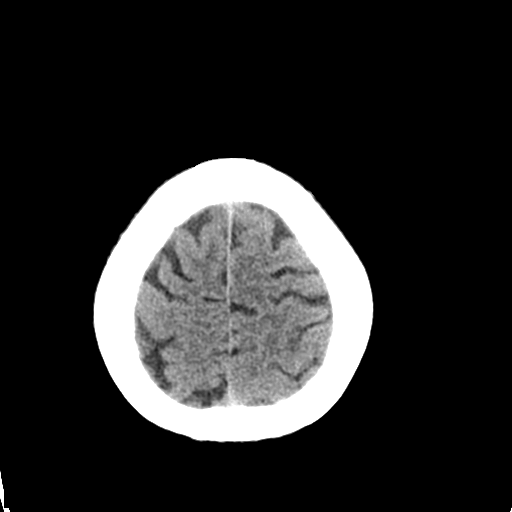
[im 25/31  bone]
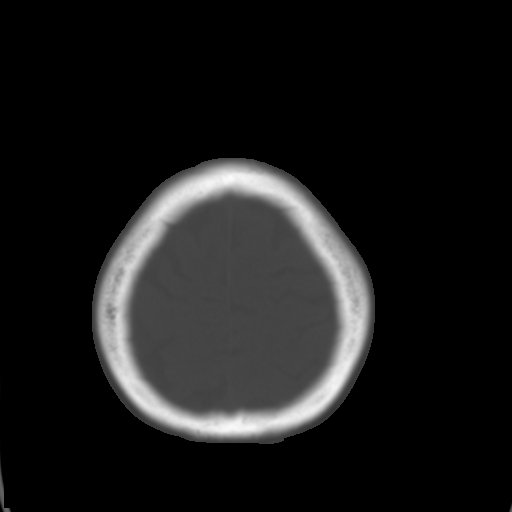
[im 28/31  brain]
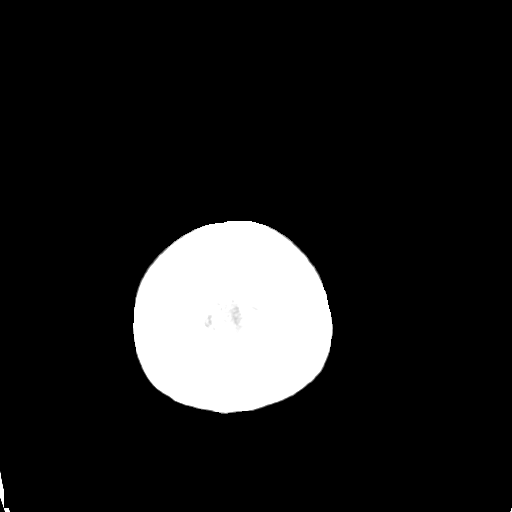

[Series 4: coronal soft tissue · coronal · 0.31mm/px · 3 of 63 slices shown]
[im 21/63  brain]
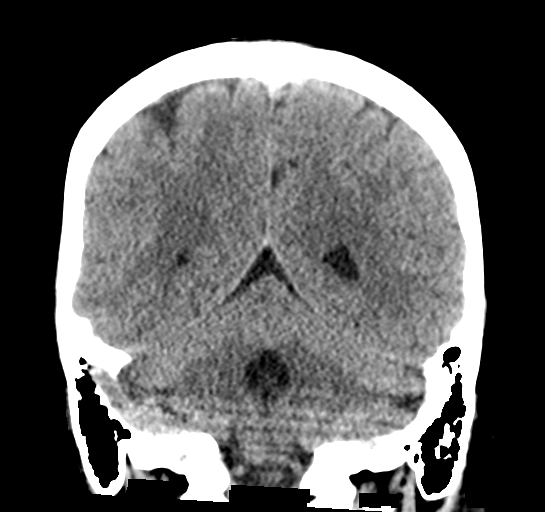
[im 28/63  brain]
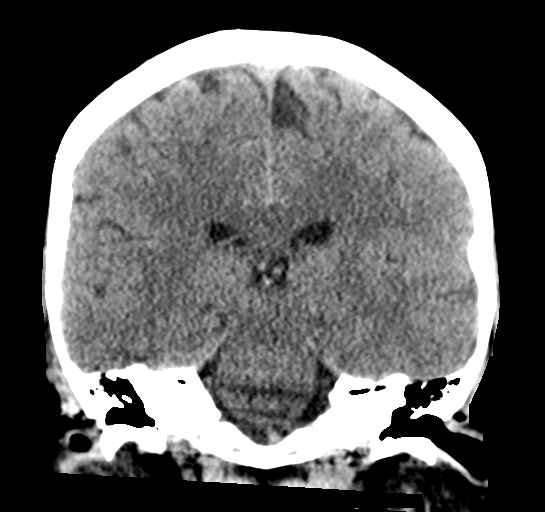
[im 35/63  brain]
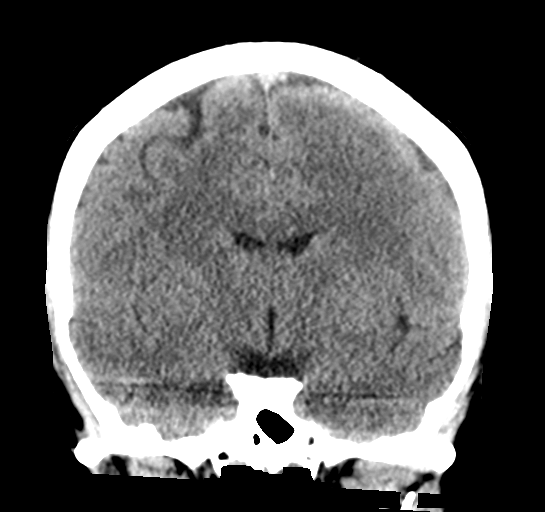

[Series 5: sagittal soft tissue · sagittal · 0.31mm/px · 3 of 56 slices shown]
[im 19/56  brain]
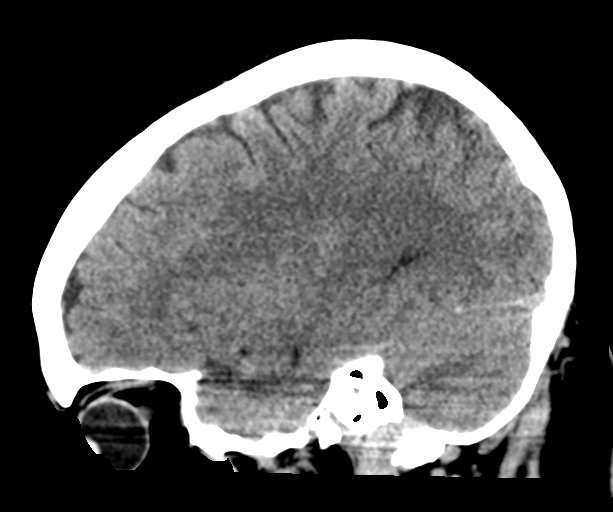
[im 28/56  brain]
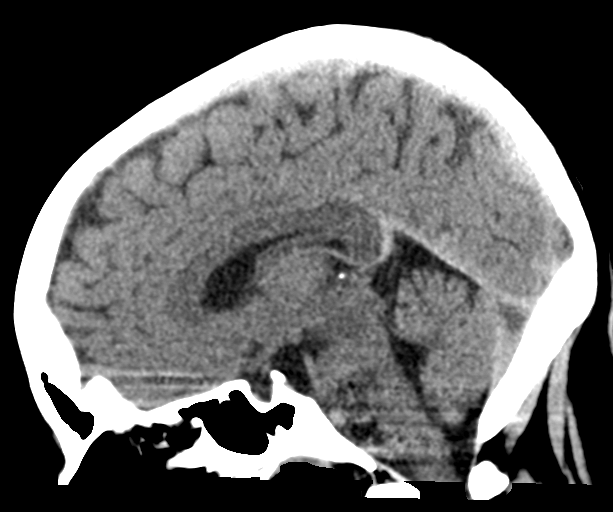
[im 37/56  brain]
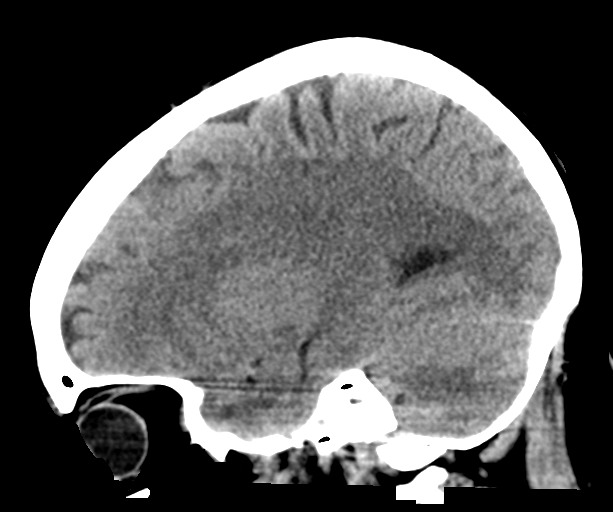

[16 of 47 positions shown; findings below may reference images not displayed]

FINDINGS: Brain: No hemorrhage. No extraaxial collection.No midline shift. The
ventricular system is unremarkable.The basal cisterns are
unremarkable. The grey white differentiation is unremarkable. There
appears to be artifact coursing through the patient's pons. The
cerebellum is unremarkable. The sella is unremarkable.

Vascular: No hyperdense vessel or unexpected calcification.

Skull: The calvarium is unremarkable. The skull base is
unremarkable. The visualized upper cervical spine is unremarkable.

Sinuses/Orbits: The visualized orbits are unremarkable. The
paranasal sinuses are unremarkable. The mastoid air cells are clear.

Other: The visualized parotid gland is unremarkable. There is no
scalp soft tissue swelling.
IMPRESSION: No acute intracranial abnormality.

## 2023-03-20 ENCOUNTER — Inpatient Hospital Stay: Payer: BC Managed Care – PPO | Admitting: Oncology

## 2023-03-20 ENCOUNTER — Inpatient Hospital Stay: Payer: BC Managed Care – PPO

## 2023-04-05 ENCOUNTER — Inpatient Hospital Stay: Payer: BC Managed Care – PPO | Attending: Oncology | Admitting: Oncology

## 2023-04-05 ENCOUNTER — Encounter: Payer: Self-pay | Admitting: Oncology

## 2023-04-05 ENCOUNTER — Inpatient Hospital Stay: Payer: BC Managed Care – PPO

## 2023-04-05 VITALS — BP 124/84 | HR 98 | Temp 98.0°F | Resp 16 | Ht 66.0 in | Wt 210.0 lb

## 2023-04-05 VITALS — BP 111/75 | HR 89

## 2023-04-05 DIAGNOSIS — Z79899 Other long term (current) drug therapy: Secondary | ICD-10-CM | POA: Diagnosis not present

## 2023-04-05 DIAGNOSIS — Z3A33 33 weeks gestation of pregnancy: Secondary | ICD-10-CM | POA: Insufficient documentation

## 2023-04-05 DIAGNOSIS — E538 Deficiency of other specified B group vitamins: Secondary | ICD-10-CM | POA: Insufficient documentation

## 2023-04-05 DIAGNOSIS — Z3A00 Weeks of gestation of pregnancy not specified: Secondary | ICD-10-CM

## 2023-04-05 DIAGNOSIS — Z9481 Bone marrow transplant status: Secondary | ICD-10-CM | POA: Diagnosis not present

## 2023-04-05 DIAGNOSIS — O99013 Anemia complicating pregnancy, third trimester: Secondary | ICD-10-CM | POA: Diagnosis not present

## 2023-04-05 DIAGNOSIS — D509 Iron deficiency anemia, unspecified: Secondary | ICD-10-CM | POA: Diagnosis present

## 2023-04-05 DIAGNOSIS — Z7982 Long term (current) use of aspirin: Secondary | ICD-10-CM | POA: Diagnosis not present

## 2023-04-05 DIAGNOSIS — Z8571 Personal history of Hodgkin lymphoma: Secondary | ICD-10-CM | POA: Insufficient documentation

## 2023-04-05 MED ORDER — SODIUM CHLORIDE 0.9 % IV SOLN
Freq: Once | INTRAVENOUS | Status: AC
  Filled 2023-04-05: qty 250

## 2023-04-05 MED ORDER — SODIUM CHLORIDE 0.9 % IV SOLN
510.0000 mg | Freq: Once | INTRAVENOUS | Status: AC
  Administered 2023-04-05: 510 mg via INTRAVENOUS
  Filled 2023-04-05: qty 17

## 2023-04-05 MED ORDER — CYANOCOBALAMIN 1000 MCG/ML IJ SOLN
1000.0000 ug | Freq: Once | INTRAMUSCULAR | Status: AC
  Administered 2023-04-05: 1000 ug via INTRAMUSCULAR
  Filled 2023-04-05: qty 1

## 2023-04-05 MED ORDER — SODIUM CHLORIDE 0.9% FLUSH
10.0000 mL | Freq: Once | INTRAVENOUS | Status: AC | PRN
  Administered 2023-04-05: 10 mL
  Filled 2023-04-05: qty 10

## 2023-04-05 NOTE — Progress Notes (Signed)
[facility]  Telephone:(336) [phone] Fax:(336) [phone]  ID: Alisha Vincent OB: 1991/03/25  MR#: [id number]  YQM#:[id number]  Patient Care Team: Danella Penton, MD as PCP - General (Unknown Physician Specialty) Jeralyn Ruths, MD as Consulting Physician (Oncology)    CHIEF COMPLAINT: Iron deficiency anemia in pregnancy.  INTERVAL HISTORY: Patient last evaluated in clinic slightly over 2 years ago.  She was referred back for iron deficiency anemia in her third trimester of her third pregnancy.  She currently feels well and is asymptomatic.  She continues to be active and work full-time.  She has no neurologic complaints.  She has a good appetite and is gaining weight appropriately.  She had she denies any chest pain, shortness of breath, cough, or hemoptysis.  She denies any nausea, vomiting, constipation, or diarrhea. She has no urinary complaints.  Patient offers no specific complaints today.  REVIEW OF SYSTEMS:   Review of Systems  Constitutional:  Negative for fever, malaise/fatigue and weight loss.  HENT: Negative.    Respiratory: Negative.  Negative for cough and shortness of breath.   Cardiovascular: Negative.  Negative for chest pain and leg swelling.  Gastrointestinal: Negative.  Negative for abdominal pain.  Genitourinary: Negative.   Musculoskeletal: Negative.   Skin: Negative.  Negative for rash.  Neurological: Negative.  Negative for weakness.  Endo/Heme/Allergies:  Does not bruise/bleed easily.  Psychiatric/Behavioral: Negative.  The patient is not nervous/anxious.     As per HPI. Otherwise, a complete review of systems is negative.  PAST MEDICAL HISTORY: Past Medical History:  Diagnosis Date   Encounter for fertility preservation procedure Nov 2012   Hodgkin's disease, unspecified type, of lymph nodes of multiple sites     PAST SURGICAL HISTORY: Past Surgical History:  Procedure Laterality Date   LYMPH NODE BIOPSY  2012   PORTA CATH  REMOVAL N/A 07/19/2017   Procedure: PORTA CATH REMOVAL;  Surgeon: Alisha Needy, MD;  Location: [facility];  Service: Cardiovascular;  Laterality: N/A;   PORTACATH PLACEMENT  Oct 2012   wisdom teeth removal   AGE 3    FAMILY HISTORY: Reviewed and unchanged. No reported history of malignancy or chronic disease.     ADVANCED DIRECTIVES:    HEALTH MAINTENANCE: Social History   Tobacco Use   Smoking status: Never   Smokeless tobacco: Never  Substance Use Topics   Alcohol use: Yes   Drug use: No     Colonoscopy:  PAP:  Bone density:  Lipid panel:  Allergies  Allergen Reactions   Cyanoacrylate Rash   Tape Rash    Current Outpatient Medications  Medication Sig Dispense Refill   acetaminophen (TYLENOL) 325 MG tablet Take 650 mg by mouth every 6 (six) hours as needed (for pain.).     aspirin EC 81 MG tablet Take 81 mg by mouth daily.     Cholecalciferol 50 MCG (2000 UT) TABS Take 2,000 Units by mouth daily.     cyanocobalamin 2000 MCG tablet Take 2,000 mcg by mouth daily.     dexmethylphenidate (FOCALIN) 10 MG tablet Take 10 mg by mouth 2 (two) times daily.      lansoprazole (PREVACID) 30 MG capsule Take 30 mg by mouth daily.     No current facility-administered medications for this visit.   Facility-Administered Medications Ordered in Other Visits  Medication Dose Route Frequency Provider Last Rate Last Admin   sodium chloride flush (NS) 0.9 % injection 10 mL  10 mL Intravenous PRN Vinal Rosengrant,  Tollie Pizza, MD   10 mL at 03/14/17 1044    OBJECTIVE: There were no vitals filed for this visit.   Body mass index is 33.89 kg/m.    ECOG FS:0 - Asymptomatic  General: Well-developed, well-nourished, no acute distress. Eyes: Pink conjunctiva, anicteric sclera. HEENT: Normocephalic, moist mucous membranes. Lungs: No audible wheezing or coughing. Heart: Regular rate and rhythm. Abdomen: Appears propria for gestational age.   Musculoskeletal: No edema, cyanosis, or  clubbing. Neuro: Alert, answering all questions appropriately. Cranial nerves grossly intact. Skin: No rashes or petechiae noted. Psych: Normal affect.   LAB RESULTS:  Lab Results  Component Value Date   NA 134 (L) 10/01/2020   K 3.7 10/01/2020   CL 102 10/01/2020   CO2 19 (L) 10/01/2020   GLUCOSE 94 10/01/2020   BUN 24 (H) 10/01/2020   CREATININE 1.22 (H) 10/01/2020   CALCIUM 8.4 (L) 10/01/2020   PROT 7.6 10/01/2020   ALBUMIN 4.2 10/01/2020   AST 23 10/01/2020   ALT 16 10/01/2020   ALKPHOS 67 10/01/2020   BILITOT 1.9 (H) 10/01/2020   GFRNONAA >60 10/01/2020   GFRAA >60 05/15/2018    Lab Results  Component Value Date   WBC 13.0 (H) 10/01/2020   NEUTROABS 11.1 (H) 10/01/2020   HGB 14.3 10/01/2020   HCT 42.2 10/01/2020   MCV 92.1 10/01/2020   PLT 286 10/01/2020   Lab Results  Component Value Date   IRON 111 10/01/2019   TIBC 385 10/01/2019   IRONPCTSAT 29 10/01/2019   Lab Results  Component Value Date   FERRITIN 46 10/01/2019     STUDIES: No results found.   ASSESSMENT: Anemia in pregnancy.  PLAN:   Anemia in pregnancy: Patient is in her third trimester and was noted to have a declining hemoglobin of 9.7 as well as significantly reduced iron stores.  Patient also required iron infusions with her first 2 pregnancies.  Proceed with 510 mg IV Feraheme today.  Return to clinic in 1 week for second infusion.  Patient will then return to clinic in mid October for further evaluation and consider additional iron if needed.   B12 deficiency: Proceed with B12 injection today.  Will give a second injection at her next clinic visit.   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  Imaging results from October 20, 2020 reviewed independently with no obvious evidence of recurrent or progressive disease.  Patient remains in complete  remission.   Pregnancy: Patient reports her first 2 pregnancies were natural, this third  was IVF.  Due date is June 01, 2023.    I spent a total of 30 minutes reviewing chart data, face-to-face evaluation with the patient, counseling and coordination of care as detailed above.   Patient expressed understanding and was in agreement with this plan. She also understands that She can call clinic at any time with any questions, concerns, or complaints.    Jeralyn Ruths, MD   04/05/2023 3:14 PM

## 2023-04-12 ENCOUNTER — Inpatient Hospital Stay: Payer: BC Managed Care – PPO

## 2023-04-12 VITALS — BP 129/80 | HR 91 | Temp 98.2°F | Resp 20

## 2023-04-12 DIAGNOSIS — O99013 Anemia complicating pregnancy, third trimester: Secondary | ICD-10-CM

## 2023-04-12 MED ORDER — SODIUM CHLORIDE 0.9 % IV SOLN
Freq: Once | INTRAVENOUS | Status: AC
  Filled 2023-04-12: qty 250

## 2023-04-12 MED ORDER — SODIUM CHLORIDE 0.9 % IV SOLN
510.0000 mg | Freq: Once | INTRAVENOUS | Status: AC
  Administered 2023-04-12: 510 mg via INTRAVENOUS
  Filled 2023-04-12: qty 17

## 2023-04-12 NOTE — Patient Instructions (Signed)
 Ferumoxytol Injection What is this medication? FERUMOXYTOL (FER ue MOX i tol) treats low levels of iron in your body (iron deficiency anemia). Iron is a mineral that plays an important role in making red blood cells, which carry oxygen from your lungs to the rest of your body. This medicine may be used for other purposes; ask your health care provider or pharmacist if you have questions. COMMON BRAND NAME(S): Feraheme What should I tell my care team before I take this medication? They need to know if you have any of these conditions: Anemia not caused by low iron levels High levels of iron in the blood Magnetic resonance imaging (MRI) test scheduled An unusual or allergic reaction to iron, other medications, foods, dyes, or preservatives Pregnant or trying to get pregnant Breastfeeding How should I use this medication? This medication is injected into a vein. It is given by your care team in a hospital or clinic setting. Talk to your care team the use of this medication in children. Special care may be needed. Overdosage: If you think you have taken too much of this medicine contact a poison control center or emergency room at once. NOTE: This medicine is only for you. Do not share this medicine with others. What if I miss a dose? It is important not to miss your dose. Call your care team if you are unable to keep an appointment. What may interact with this medication? Other iron products This list may not describe all possible interactions. Give your health care provider a list of all the medicines, herbs, non-prescription drugs, or dietary supplements you use. Also tell them if you smoke, drink alcohol, or use illegal drugs. Some items may interact with your medicine. What should I watch for while using this medication? Visit your care team regularly. Tell your care team if your symptoms do not start to get better or if they get worse. You may need blood work done while you are taking this  medication. You may need to follow a special diet. Talk to your care team. Foods that contain iron include: whole grains/cereals, dried fruits, beans, or peas, leafy green vegetables, and organ meats (liver, kidney). What side effects may I notice from receiving this medication? Side effects that you should report to your care team as soon as possible: Allergic reactions--skin rash, itching, hives, swelling of the face, lips, tongue, or throat Low blood pressure--dizziness, feeling faint or lightheaded, blurry vision Shortness of breath Side effects that usually do not require medical attention (report to your care team if they continue or are bothersome): Flushing Headache Joint pain Muscle pain Nausea Pain, redness, or irritation at injection site This list may not describe all possible side effects. Call your doctor for medical advice about side effects. You may report side effects to FDA at 1-800-FDA-1088. Where should I keep my medication? This medication is given in a hospital or clinic. It will not be stored at home. NOTE: This sheet is a summary. It may not cover all possible information. If you have questions about this medicine, talk to your doctor, pharmacist, or health care provider.  2024 Elsevier/Gold Standard (2022-12-22 00:00:00)

## 2023-04-12 NOTE — Progress Notes (Signed)
Pt declined to stay for the 30 minute post feraheme observation period.  Pt tolerated infusion well with no complaints and left stable and ambulatory.

## 2023-05-18 ENCOUNTER — Inpatient Hospital Stay: Payer: BC Managed Care – PPO | Attending: Oncology

## 2023-05-18 ENCOUNTER — Other Ambulatory Visit: Payer: Self-pay | Admitting: *Deleted

## 2023-05-18 DIAGNOSIS — E538 Deficiency of other specified B group vitamins: Secondary | ICD-10-CM | POA: Insufficient documentation

## 2023-05-18 DIAGNOSIS — Z3A00 Weeks of gestation of pregnancy not specified: Secondary | ICD-10-CM | POA: Diagnosis not present

## 2023-05-18 DIAGNOSIS — Z9481 Bone marrow transplant status: Secondary | ICD-10-CM | POA: Diagnosis not present

## 2023-05-18 DIAGNOSIS — D509 Iron deficiency anemia, unspecified: Secondary | ICD-10-CM | POA: Diagnosis present

## 2023-05-18 DIAGNOSIS — O99013 Anemia complicating pregnancy, third trimester: Secondary | ICD-10-CM | POA: Insufficient documentation

## 2023-05-18 DIAGNOSIS — Z8571 Personal history of Hodgkin lymphoma: Secondary | ICD-10-CM | POA: Diagnosis not present

## 2023-05-18 LAB — CBC WITH DIFFERENTIAL/PLATELET
Abs Immature Granulocytes: 0.09 10*3/uL — ABNORMAL HIGH (ref 0.00–0.07)
Basophils Absolute: 0 10*3/uL (ref 0.0–0.1)
Basophils Relative: 0 %
Eosinophils Absolute: 0.1 10*3/uL (ref 0.0–0.5)
Eosinophils Relative: 2 %
HCT: 33 % — ABNORMAL LOW (ref 36.0–46.0)
Hemoglobin: 11.3 g/dL — ABNORMAL LOW (ref 12.0–15.0)
Immature Granulocytes: 1 %
Lymphocytes Relative: 27 %
Lymphs Abs: 2.2 10*3/uL (ref 0.7–4.0)
MCH: 34.1 pg — ABNORMAL HIGH (ref 26.0–34.0)
MCHC: 34.2 g/dL (ref 30.0–36.0)
MCV: 99.7 fL (ref 80.0–100.0)
Monocytes Absolute: 0.3 10*3/uL (ref 0.1–1.0)
Monocytes Relative: 4 %
Neutro Abs: 5.5 10*3/uL (ref 1.7–7.7)
Neutrophils Relative %: 66 %
Platelets: 242 10*3/uL (ref 150–400)
RBC: 3.31 MIL/uL — ABNORMAL LOW (ref 3.87–5.11)
RDW: 14.9 % (ref 11.5–15.5)
WBC: 8.3 10*3/uL (ref 4.0–10.5)
nRBC: 0 % (ref 0.0–0.2)

## 2023-05-18 LAB — FERRITIN: Ferritin: 127 ng/mL (ref 11–307)

## 2023-05-18 LAB — IRON AND TIBC
Iron: 86 ug/dL (ref 28–170)
Saturation Ratios: 21 % (ref 10.4–31.8)
TIBC: 417 ug/dL (ref 250–450)
UIBC: 331 ug/dL

## 2023-05-18 LAB — VITAMIN B12: Vitamin B-12: 335 pg/mL (ref 180–914)

## 2023-05-21 ENCOUNTER — Inpatient Hospital Stay (HOSPITAL_BASED_OUTPATIENT_CLINIC_OR_DEPARTMENT_OTHER): Payer: BC Managed Care – PPO | Admitting: Oncology

## 2023-05-21 ENCOUNTER — Encounter: Payer: Self-pay | Admitting: Oncology

## 2023-05-21 ENCOUNTER — Inpatient Hospital Stay: Payer: BC Managed Care – PPO

## 2023-05-21 VITALS — BP 124/81 | HR 96 | Temp 97.2°F | Resp 16 | Ht 66.0 in | Wt 220.0 lb

## 2023-05-21 DIAGNOSIS — O99013 Anemia complicating pregnancy, third trimester: Secondary | ICD-10-CM | POA: Diagnosis not present

## 2023-05-21 DIAGNOSIS — Z3A00 Weeks of gestation of pregnancy not specified: Secondary | ICD-10-CM | POA: Diagnosis not present

## 2023-05-21 DIAGNOSIS — D509 Iron deficiency anemia, unspecified: Secondary | ICD-10-CM

## 2023-05-21 NOTE — Progress Notes (Unsigned)
[facility]  [phone] [phone]  ID: Alisha Vincent OB: Apr 09, 1991  MR#: [id number]  MWN#:[id number]  Patient Care Team: Danella Penton, MD as PCP - General (Unknown Physician Specialty) Jeralyn Ruths, MD as Consulting Physician (Oncology)    CHIEF COMPLAINT: Iron deficiency anemia in pregnancy.  INTERVAL HISTORY: Patient returns to clinic today for repeat laboratory work and further evaluation.  She reports feeling improved after receiving IV iron recently.  She currently feels well and is asymptomatic. She continues to be active and work full-time.  She has no neurologic complaints.  She has a good appetite and is gaining weight appropriately.  She denies any chest pain, shortness of breath, cough, or hemoptysis.  She denies any nausea, vomiting, constipation, or diarrhea. She has no urinary complaints.  Patient offers no specific complaints today.  REVIEW OF SYSTEMS:   Review of Systems  Constitutional:  Negative for fever, malaise/fatigue and weight loss.  HENT: Negative.    Respiratory: Negative.  Negative for cough and shortness of breath.   Cardiovascular: Negative.  Negative for chest pain and leg swelling.  Gastrointestinal: Negative.  Negative for abdominal pain.  Genitourinary: Negative.   Musculoskeletal: Negative.   Skin: Negative.  Negative for rash.  Neurological: Negative.  Negative for weakness.  Endo/Heme/Allergies:  Does not bruise/bleed easily.  Psychiatric/Behavioral: Negative.  The patient is not nervous/anxious.     As per HPI. Otherwise, a complete review of systems is negative.  PAST MEDICAL HISTORY: Past Medical History:  Diagnosis Date   Encounter for fertility preservation procedure Nov 2012   Hodgkin's disease, unspecified type, of lymph nodes of multiple sites     PAST SURGICAL HISTORY: Past Surgical History:  Procedure Laterality Date   LYMPH NODE BIOPSY  2012   PORTA CATH REMOVAL N/A  07/19/2017   Procedure: PORTA CATH REMOVAL;  Surgeon: Alisha Needy, MD;  Location: [facility] INVASIVE CV LAB;  Service: Cardiovascular;  Laterality: N/A;   PORTACATH PLACEMENT  Oct 2012   wisdom teeth removal   AGE 13    FAMILY HISTORY: Reviewed and unchanged. No reported history of malignancy or chronic disease.     ADVANCED DIRECTIVES:    HEALTH MAINTENANCE: Social History   Tobacco Use   Smoking status: Never   Smokeless tobacco: Never  Substance Use Topics   Alcohol use: Yes   Drug use: No     Colonoscopy:  PAP:  Bone density:  Lipid panel:  Allergies  Allergen Reactions   Cyanoacrylate Rash   Tape Rash    Current Outpatient Medications  Medication Sig Dispense Refill   acetaminophen (TYLENOL) 325 MG tablet Take 650 mg by mouth every 6 (six) hours as needed (for pain.).     aspirin EC 81 MG tablet Take 81 mg by mouth daily.     Cholecalciferol 50 MCG (2000 UT) TABS Take 2,000 Units by mouth daily.     cyanocobalamin 2000 MCG tablet Take 2,000 mcg by mouth daily.     dexmethylphenidate (FOCALIN) 10 MG tablet Take 10 mg by mouth 2 (two) times daily.      lansoprazole (PREVACID) 30 MG capsule Take 30 mg by mouth daily.     No current facility-administered medications for this visit.   Facility-Administered Medications Ordered in Other Visits  Medication Dose Route Frequency Provider Last Rate Last Admin   sodium chloride flush (NS) 0.9 % injection 10 mL  10 mL Intravenous PRN Orlie Dakin, Tollie Pizza, MD   10 mL at  03/14/17 1044    OBJECTIVE: Vitals:   05/21/23 1428  BP: 124/81  Pulse: 96  Resp: 16  Temp: (!) 97.2 F (36.2 C)  SpO2: 100%     Body mass index is 35.51 kg/m.    ECOG FS:0 - Asymptomatic  General: Well-developed, well-nourished, no acute distress. Eyes: Pink conjunctiva, anicteric sclera. HEENT: Normocephalic, moist mucous membranes. Lungs: No audible wheezing or coughing. Heart: Regular rate and rhythm. Abdomen: Soft, nontender, no obvious  distention. Musculoskeletal: No edema, cyanosis, or clubbing. Neuro: Alert, answering all questions appropriately. Cranial nerves grossly intact. Skin: No rashes or petechiae noted. Psych: Normal affect.  LAB RESULTS:  Lab Results  Component Value Date   NA 134 (L) 10/01/2020   K 3.7 10/01/2020   CL 102 10/01/2020   CO2 19 (L) 10/01/2020   GLUCOSE 94 10/01/2020   BUN 24 (H) 10/01/2020   CREATININE 1.22 (H) 10/01/2020   CALCIUM 8.4 (L) 10/01/2020   PROT 7.6 10/01/2020   ALBUMIN 4.2 10/01/2020   AST 23 10/01/2020   ALT 16 10/01/2020   ALKPHOS 67 10/01/2020   BILITOT 1.9 (H) 10/01/2020   GFRNONAA >60 10/01/2020   GFRAA >60 05/15/2018    Lab Results  Component Value Date   WBC 8.3 05/18/2023   NEUTROABS 5.5 05/18/2023   HGB 11.3 (L) 05/18/2023   HCT 33.0 (L) 05/18/2023   MCV 99.7 05/18/2023   PLT 242 05/18/2023   Lab Results  Component Value Date   IRON 86 05/18/2023   TIBC 417 05/18/2023   IRONPCTSAT 21 05/18/2023   Lab Results  Component Value Date   FERRITIN 127 05/18/2023     STUDIES: No results found.   ASSESSMENT: Anemia in pregnancy.  PLAN:   Anemia in pregnancy: Patient's hemoglobin has significantly improved to 11.3 and her iron stores are now normalized.  Of note, she also required iron infusions with her first 2 pregnancies.  Patient last received 510 mg of IV Feraheme on April 12, 2023.  No further follow-up has been scheduled at this time.    B12 deficiency: Patient received B12 injection on April 05, 2023. 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  Imaging results from October 20, 2020 reviewed independently with no obvious evidence of recurrent or progressive disease.  Patient remains in complete remission.   Pregnancy: Patient reports her first 2 pregnancies were natural, this third  was via IVF.   Patient reports that she is undergoing induction on May 26, 2023.  I spent a total of 20 minutes reviewing chart data, face-to-face evaluation with the patient, counseling and coordination of care as detailed above.    Patient expressed understanding and was in agreement with this plan. She also understands that She can call clinic at any time with any questions, concerns, or complaints.    Jeralyn Ruths, MD   05/22/2023 3:50 PM

## 2023-05-22 ENCOUNTER — Encounter: Payer: Self-pay | Admitting: Oncology

## 2023-07-30 ENCOUNTER — Encounter: Payer: Self-pay | Admitting: Oncology

## 2023-08-29 ENCOUNTER — Encounter: Payer: Self-pay | Admitting: Oncology

## 2023-08-29 ENCOUNTER — Emergency Department: Payer: 59

## 2023-08-29 ENCOUNTER — Emergency Department
Admission: EM | Admit: 2023-08-29 | Discharge: 2023-08-29 | Disposition: A | Discharge status: Home / Self Care | Payer: 59 | Attending: Emergency Medicine | Admitting: Emergency Medicine

## 2023-08-29 ENCOUNTER — Other Ambulatory Visit: Payer: Self-pay

## 2023-08-29 DIAGNOSIS — Z8572 Personal history of non-Hodgkin lymphomas: Secondary | ICD-10-CM | POA: Insufficient documentation

## 2023-08-29 DIAGNOSIS — R112 Nausea with vomiting, unspecified: Secondary | ICD-10-CM | POA: Insufficient documentation

## 2023-08-29 DIAGNOSIS — K769 Liver disease, unspecified: Secondary | ICD-10-CM | POA: Insufficient documentation

## 2023-08-29 DIAGNOSIS — E860 Dehydration: Secondary | ICD-10-CM | POA: Insufficient documentation

## 2023-08-29 DIAGNOSIS — R197 Diarrhea, unspecified: Secondary | ICD-10-CM | POA: Diagnosis not present

## 2023-08-29 DIAGNOSIS — Z20822 Contact with and (suspected) exposure to covid-19: Secondary | ICD-10-CM | POA: Diagnosis not present

## 2023-08-29 LAB — COMPREHENSIVE METABOLIC PANEL
ALT: 40 U/L (ref 0–44)
AST: 43 U/L — ABNORMAL HIGH (ref 15–41)
Albumin: 4.8 g/dL (ref 3.5–5.0)
Alkaline Phosphatase: 77 U/L (ref 38–126)
Anion gap: 28 — ABNORMAL HIGH (ref 5–15)
BUN: 19 mg/dL (ref 6–20)
CO2: 11 mmol/L — ABNORMAL LOW (ref 22–32)
Calcium: 11.1 mg/dL — ABNORMAL HIGH (ref 8.9–10.3)
Chloride: 99 mmol/L (ref 98–111)
Creatinine, Ser: 1.29 mg/dL — ABNORMAL HIGH (ref 0.44–1.00)
GFR, Estimated: 56 mL/min — ABNORMAL LOW (ref >60)
Glucose, Bld: 186 mg/dL — ABNORMAL HIGH (ref 70–99)
Potassium: 3.7 mmol/L (ref 3.5–5.1)
Sodium: 138 mmol/L (ref 135–145)
Total Bilirubin: 1.7 mg/dL — ABNORMAL HIGH (ref 0.0–1.2)
Total Protein: 8.8 g/dL — ABNORMAL HIGH (ref 6.5–8.1)

## 2023-08-29 LAB — RESP PANEL BY RT-PCR (RSV, FLU A&B, COVID)  RVPGX2
Influenza A by PCR: NEGATIVE
Influenza B by PCR: NEGATIVE
Resp Syncytial Virus by PCR: NEGATIVE
SARS Coronavirus 2 by RT PCR: NEGATIVE

## 2023-08-29 LAB — URINALYSIS, ROUTINE W REFLEX MICROSCOPIC
Bilirubin Urine: NEGATIVE
Glucose, UA: NEGATIVE mg/dL
Ketones, ur: NEGATIVE mg/dL
Leukocytes,Ua: NEGATIVE
Nitrite: NEGATIVE
Protein, ur: 30 mg/dL — AB
Specific Gravity, Urine: >1.046 — ABNORMAL HIGH (ref 1.005–1.030)
pH: 5 (ref 5.0–8.0)

## 2023-08-29 LAB — CBC
HCT: 47.9 % — ABNORMAL HIGH (ref 36.0–46.0)
Hemoglobin: 16.8 g/dL — ABNORMAL HIGH (ref 12.0–15.0)
MCH: 32.4 pg (ref 26.0–34.0)
MCHC: 35.1 g/dL (ref 30.0–36.0)
MCV: 92.5 fL (ref 80.0–100.0)
Platelets: 410 10*3/uL — ABNORMAL HIGH (ref 150–400)
RBC: 5.18 MIL/uL — ABNORMAL HIGH (ref 3.87–5.11)
RDW: 11.9 % (ref 11.5–15.5)
WBC: 21.4 10*3/uL — ABNORMAL HIGH (ref 4.0–10.5)
nRBC: 0 % (ref 0.0–0.2)

## 2023-08-29 LAB — BASIC METABOLIC PANEL
Anion gap: 12 (ref 5–15)
BUN: 20 mg/dL (ref 6–20)
CO2: 19 mmol/L — ABNORMAL LOW (ref 22–32)
Calcium: 8.1 mg/dL — ABNORMAL LOW (ref 8.9–10.3)
Chloride: 108 mmol/L (ref 98–111)
Creatinine, Ser: 1.24 mg/dL — ABNORMAL HIGH (ref 0.44–1.00)
GFR, Estimated: 59 mL/min — ABNORMAL LOW (ref >60)
Glucose, Bld: 111 mg/dL — ABNORMAL HIGH (ref 70–99)
Potassium: 3.8 mmol/L (ref 3.5–5.1)
Sodium: 139 mmol/L (ref 135–145)

## 2023-08-29 LAB — LIPASE, BLOOD: Lipase: 44 U/L (ref 11–51)

## 2023-08-29 LAB — POC URINE PREG, ED
Preg Test, Ur: NEGATIVE
Preg Test, Ur: NEGATIVE

## 2023-08-29 LAB — TROPONIN I (HIGH SENSITIVITY): Troponin I (High Sensitivity): 3 ng/L (ref <18)

## 2023-08-29 MED ORDER — SODIUM CHLORIDE 0.9 % IV BOLUS
1000.0000 mL | Freq: Once | INTRAVENOUS | Status: AC
  Administered 2023-08-29: 1000 mL via INTRAVENOUS

## 2023-08-29 MED ORDER — ONDANSETRON HCL 4 MG/2ML IJ SOLN
4.0000 mg | Freq: Once | INTRAMUSCULAR | Status: AC
  Administered 2023-08-29: 4 mg via INTRAVENOUS
  Filled 2023-08-29: qty 2

## 2023-08-29 MED ORDER — IOHEXOL 300 MG/ML  SOLN
100.0000 mL | Freq: Once | INTRAMUSCULAR | Status: AC | PRN
  Administered 2023-08-29: 100 mL via INTRAVENOUS

## 2023-08-29 MED ORDER — ONDANSETRON 4 MG PO TBDP
4.0000 mg | ORAL_TABLET | Freq: Once | ORAL | Status: AC
Start: 2023-08-29 — End: 2023-08-29
  Administered 2023-08-29: 4 mg via ORAL
  Filled 2023-08-29: qty 1

## 2023-08-29 MED ORDER — FAMOTIDINE 20 MG PO TABS: 20.0000 mg | ORAL_TABLET | Freq: Every day | ORAL | 0 refills | Status: AC

## 2023-08-29 MED ORDER — MORPHINE SULFATE (PF) 4 MG/ML IV SOLN
4.0000 mg | Freq: Once | INTRAVENOUS | Status: AC
  Administered 2023-08-29: 4 mg via INTRAVENOUS
  Filled 2023-08-29: qty 1

## 2023-08-29 MED ORDER — FAMOTIDINE IN NACL 20-0.9 MG/50ML-% IV SOLN
20.0000 mg | Freq: Once | INTRAVENOUS | Status: AC
Start: 2023-08-29 — End: 2023-08-29
  Administered 2023-08-29: 20 mg via INTRAVENOUS
  Filled 2023-08-29: qty 50

## 2023-08-29 MED ORDER — ONDANSETRON 4 MG PO TBDP: 4.0000 mg | ORAL_TABLET | Freq: Three times a day (TID) | ORAL | 0 refills | Status: AC | PRN

## 2023-08-29 MED ORDER — ONDANSETRON HCL 4 MG/2ML IJ SOLN
4.0000 mg | Freq: Once | INTRAMUSCULAR | Status: AC
Start: 2023-08-29 — End: 2023-08-29
  Administered 2023-08-29: 4 mg via INTRAVENOUS
  Filled 2023-08-29: qty 2

## 2023-08-29 MED ORDER — ONDANSETRON HCL 4 MG/2ML IJ SOLN
4.0000 mg | Freq: Once | INTRAMUSCULAR | Status: AC | PRN
  Administered 2023-08-29: 4 mg via INTRAVENOUS
  Filled 2023-08-29: qty 2

## 2023-08-29 NOTE — ED Provider Notes (Signed)
[facility] Provider Note    Event Date/Time   First MD Initiated Contact with Patient 08/29/23 0809     (approximate)   History   Abdominal Pain   HPI  Alisha Vincent is a 33 y.o. female past medical history significant for non-Hodgkin's lymphoma in remission, who presents to the emergency department with abdominal pain associated with nausea, vomiting and diarrhea.  States that family members at home have been very sick with what she believes was norovirus.  States that last night she started having nausea, vomiting and multiple episodes of diarrhea.  States that she was feeling weak and states that she gets dehydrated very easily.  Denies any active abdominal pain at this time.  Denies any shortness of breath or chest pain.     Physical Exam   Triage Vital Signs: ED Triage Vitals  Encounter Vitals Group     BP 08/29/23 0315 114/89     Systolic BP Percentile --      Diastolic BP Percentile --      Pulse Rate 08/29/23 0315 (!) 115     Resp 08/29/23 0315 (!) 24     Temp 08/29/23 0315 97.7 F (36.5 C)     Temp Source 08/29/23 0315 Oral     SpO2 08/29/23 0315 100 %     Weight 08/29/23 0313 200 lb (90.7 kg)     Height 08/29/23 0313 5\' 6"  (1.676 m)     Head Circumference --      Peak Flow --      Pain Score 08/29/23 0313 9     Pain Loc --      Pain Education --      Exclude from Growth Chart --     Most recent vital signs: Vitals:   08/29/23 1041 08/29/23 1356  BP: 100/67 97/63  Pulse: (!) 108 (!) 106  Resp: 16 14  Temp: 98 F (36.7 C) 98.4 F (36.9 C)  SpO2: 96% 96%    Physical Exam Constitutional:      Appearance: She is well-developed.  HENT:     Head: Atraumatic.     Comments: Dry mucous membranes Eyes:     Conjunctiva/sclera: Conjunctivae normal.  Cardiovascular:     Rate and Rhythm: Regular rhythm. Tachycardia present.  Pulmonary:     Effort: No respiratory distress.  Abdominal:     General: There is no distension.      Tenderness: There is no abdominal tenderness.  Musculoskeletal:        General: Normal range of motion.     Cervical back: Normal range of motion.  Skin:    General: Skin is warm.     Capillary Refill: Capillary refill takes 2 to 3 seconds.  Neurological:     Mental Status: She is alert. Mental status is at baseline.     IMPRESSION / MDM / ASSESSMENT AND PLAN / ED COURSE  I reviewed the triage vital signs and the nursing notes.  Differential diagnosis including viral illness including COVID/influenza, viral gastroenteritis, dehydration, electrolyte abnormality  EKG  I, Corena Herter, the attending physician, personally viewed and interpreted this ECG. Sinus tachycardia.  Normal intervals.  No chamber enlargement.  Nonspecific ST changes.  RADIOLOGY CT scan of the abdomen pelvis with findings concerning for mild colitis.  Incidental finding of the liver noted and I discussed this finding with the patient and that she needed repeat imaging with her primary care provider.  LABS (all labs ordered are  listed, but only abnormal results are displayed) Labs interpreted as -    Labs Reviewed  COMPREHENSIVE METABOLIC PANEL - Abnormal; Notable for the following components:      Result Value   CO2 11 (*)    Glucose, Bld 186 (*)    Creatinine, Ser 1.29 (*)    Calcium 11.1 (*)    Total Protein 8.8 (*)    AST 43 (*)    Total Bilirubin 1.7 (*)    GFR, Estimated 56 (*)    Anion gap 28 (*)    All other components within normal limits  CBC - Abnormal; Notable for the following components:   WBC 21.4 (*)    RBC 5.18 (*)    Hemoglobin 16.8 (*)    HCT 47.9 (*)    Platelets 410 (*)    All other components within normal limits  URINALYSIS, ROUTINE W REFLEX MICROSCOPIC - Abnormal; Notable for the following components:   Color, Urine YELLOW (*)    APPearance CLEAR (*)    Specific Gravity, Urine >1.046 (*)    Hgb urine dipstick MODERATE (*)    Protein, ur 30 (*)    Bacteria, UA RARE (*)     All other components within normal limits  BASIC METABOLIC PANEL - Abnormal; Notable for the following components:   CO2 19 (*)    Glucose, Bld 111 (*)    Creatinine, Ser 1.24 (*)    Calcium 8.1 (*)    GFR, Estimated 59 (*)    All other components within normal limits  RESP PANEL BY RT-PCR (RSV, FLU A&B, COVID)  RVPGX2  LIPASE, BLOOD  POC URINE PREG, ED  POC URINE PREG, ED  TROPONIN I (HIGH SENSITIVITY)     MDM    Significant leukocytosis of 21.  Hemoglobin is also elevated unlikely in the setting of dehydration.  CO2 is 11 with significantly elevated anion gap of 28 which is likely in the setting of ketosis.  Patient was given 2 L of IV fluids and IV antiemetics  On reevaluation patient is feeling much better.  Tolerating p.o.  Repeat lab work with significant improvement of CO2 down to 19 and no longer has an elevated anion gap.  Creatinine appears to be at her baseline.  No significant electrolyte abnormality.  Lipase normal.  Normal troponin.  Pregnancy test is negative.  Patient most likely with viral gastroenteritis.  Able to tolerate p.o.  Given a prescription for antiemetics.  Discussed oral hydration and discussed close follow-up with her primary care physician.  States that she has an appointment tomorrow but she will discuss if she could be seen on Friday or Monday.  Given return precautions for any return of symptoms or signs of dehydration.   PROCEDURES:  Critical Care performed: yes  .Critical Care  Performed by: Corena Herter, MD Authorized by: Corena Herter, MD   Critical care provider statement:    Critical care time (minutes):  30   Critical care was necessary to treat or prevent imminent or life-threatening deterioration of the following conditions:  Dehydration   Critical care was time spent personally by me on the following activities:  Development of treatment plan with patient or surrogate, discussions with consultants, evaluation of patient's  response to treatment, examination of patient, ordering and review of laboratory studies, ordering and review of radiographic studies, ordering and performing treatments and interventions, pulse oximetry, re-evaluation of patient's condition and review of old charts   Patient's presentation is most consistent with acute  presentation with potential threat to life or bodily function.   MEDICATIONS ORDERED IN ED: Medications  ondansetron (ZOFRAN) injection 4 mg (4 mg Intravenous Given 08/29/23 0326)  sodium chloride 0.9 % bolus 1,000 mL (0 mLs Intravenous Stopped 08/29/23 0938)  morphine (PF) 4 MG/ML injection 4 mg (4 mg Intravenous Given 08/29/23 0452)  ondansetron (ZOFRAN) injection 4 mg (4 mg Intravenous Given 08/29/23 0450)  iohexol (OMNIPAQUE) 300 MG/ML solution 100 mL (100 mLs Intravenous Contrast Given 08/29/23 0512)  sodium chloride 0.9 % bolus 1,000 mL (0 mLs Intravenous Stopped 08/29/23 1205)  morphine (PF) 4 MG/ML injection 4 mg (4 mg Intravenous Given 08/29/23 0818)  ondansetron (ZOFRAN-ODT) disintegrating tablet 4 mg (4 mg Oral Given 08/29/23 0828)  famotidine (PEPCID) IVPB 20 mg premix (0 mg Intravenous Stopped 08/29/23 0926)  ondansetron (ZOFRAN) injection 4 mg (4 mg Intravenous Given 08/29/23 0839)    FINAL CLINICAL IMPRESSION(S) / ED DIAGNOSES   Final diagnoses:  Nausea vomiting and diarrhea  Dehydration  Liver lesion     Rx / DC Orders   ED Discharge Orders          Ordered    ondansetron (ZOFRAN-ODT) 4 MG disintegrating tablet  Every 8 hours PRN        08/29/23 1405    famotidine (PEPCID) 20 MG tablet  Daily        08/29/23 1406    famotidine (PEPCID) 20 MG tablet  2 times daily        Pending    ondansetron (ZOFRAN-ODT) 4 MG disintegrating tablet  Every 8 hours PRN        Pending             Note:  This document was prepared using Dragon voice recognition software and may include unintentional dictation errors.   Corena Herter, MD 08/29/23 [phone]

## 2023-08-29 NOTE — Discharge Instructions (Addendum)
You were seen in the emergency department for nausea, vomiting and diarrhea.  You had lab work done and you were significantly dehydrated and had a very high white count.  You were given multiple liters of IV fluids and nausea medication.  Repeat lab work looked significantly improved from your first round of labs.  You need to repeat your lab work with your primary care physician.  You had an incidental finding on your CT scan of a questionable area on your liver, you can discussed with your primary care physician and they can order follow-up imaging if needed.  zofran (ondansetron) - nausea medication, take 1 tablet every 8 hours as needed for nausea/vomiting.  Thank you for choosing [place] for your health care, it was my pleasure to care for you today!  Corena Herter, MD

## 2023-08-29 NOTE — ED Triage Notes (Signed)
Pt to ED via POV c/o LLQ abd oain, vomiting, and diarrhea. Pt reports this started around 11pm last night. Fam sick at home with same. Denies CP, SHOB, dizziness

## 2023-08-29 NOTE — ED Notes (Signed)
Nasal cannula applied to pt post med admin with 3L oxygen initiated

## 2023-10-25 ENCOUNTER — Other Ambulatory Visit: Payer: Self-pay | Admitting: Internal Medicine

## 2023-10-25 DIAGNOSIS — N6320 Unspecified lump in the left breast, unspecified quadrant: Secondary | ICD-10-CM

## 2023-11-01 ENCOUNTER — Ambulatory Visit
Admission: RE | Admit: 2023-11-01 | Discharge: 2023-11-01 | Disposition: A | Discharge status: Home / Self Care | Source: Ambulatory Visit | Attending: Internal Medicine | Admitting: Internal Medicine

## 2023-11-01 DIAGNOSIS — N6320 Unspecified lump in the left breast, unspecified quadrant: Secondary | ICD-10-CM | POA: Diagnosis present
